# Patient Record
Sex: Male | Born: 2000
Health system: Southern US, Community
[De-identification: ages and names within clinical notes are randomized; demographics above are authoritative.]

---
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
## 2019-03-22 ENCOUNTER — Encounter (HOSPITAL_COMMUNITY): Payer: Self-pay | Admitting: Emergency Medicine

## 2019-03-22 ENCOUNTER — Emergency Department (HOSPITAL_COMMUNITY)
Admission: EM | Admit: 2019-03-22 | Discharge: 2019-03-22 | Disposition: A | Discharge status: Home / Self Care | Payer: Medicaid Other | Attending: Emergency Medicine | Admitting: Emergency Medicine

## 2019-03-22 ENCOUNTER — Other Ambulatory Visit: Payer: Self-pay

## 2019-03-22 DIAGNOSIS — Z7251 High risk heterosexual behavior: Secondary | ICD-10-CM

## 2019-03-22 DIAGNOSIS — A64 Unspecified sexually transmitted disease: Secondary | ICD-10-CM | POA: Diagnosis not present

## 2019-03-22 DIAGNOSIS — R369 Urethral discharge, unspecified: Secondary | ICD-10-CM | POA: Diagnosis not present

## 2019-03-22 LAB — URINALYSIS, ROUTINE W REFLEX MICROSCOPIC
Bacteria, UA: NONE SEEN
Bilirubin Urine: NEGATIVE
Glucose, UA: NEGATIVE mg/dL
Hgb urine dipstick: NEGATIVE
Ketones, ur: 5 mg/dL — AB
Nitrite: NEGATIVE
Protein, ur: 30 mg/dL — AB
Specific Gravity, Urine: 1.026 (ref 1.005–1.030)
pH: 5 (ref 5.0–8.0)

## 2019-03-22 MED ORDER — AZITHROMYCIN 250 MG PO TABS
1000.0000 mg | ORAL_TABLET | Freq: Once | ORAL | Status: AC
  Administered 2019-03-22: 21:00:00 1000 mg via ORAL
  Filled 2019-03-22: qty 4

## 2019-03-22 MED ORDER — CEFTRIAXONE SODIUM 250 MG IJ SOLR
250.0000 mg | Freq: Once | INTRAMUSCULAR | Status: AC
  Administered 2019-03-22: 21:00:00 250 mg via INTRAMUSCULAR
  Filled 2019-03-22: qty 250
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
## 2019-03-22 NOTE — ED Provider Notes (Signed)
[facility] Department Provider Note MRN:  [id number]  Arrival date & time: 03/22/19     Chief Complaint   Penile Discharge   History of Present Illness   Corey [name] is a 18 y.o. year-old male with no pertinent past medical history presenting to the ED with chief complaint of penile discharge.  2 to 3 days of burning and cloudy discharge from the penis.  Denies fever, pain is mild, intermittent.  Denies abdominal pain, no testicular pain.  2 or 3 sexual partners in the past 3 months.  Intermittent condom use.  Review of Systems  A problem-focused ROS was performed. Positive for penile discharge.  Patient denies fever.  Patient's Health History   History reviewed. No pertinent past medical history.  History reviewed. No pertinent surgical history.  No family history on file.  Social History   Socioeconomic History  . Marital status: Single    Spouse name: Not on file  . Number of children: Not on file  . Years of education: Not on file  . Highest education level: Not on file  Occupational History  . Not on file  Social Needs  . Financial resource strain: Not on file  . Food insecurity:    Worry: Not on file    Inability: Not on file  . Transportation needs:    Medical: Not on file    Non-medical: Not on file  Tobacco Use  . Smoking status: Never Smoker  Substance and Sexual Activity  . Alcohol use: No  . Drug use: Not on file  . Sexual activity: Not on file  Lifestyle  . Physical activity:    Days per week: Not on file    Minutes per session: Not on file  . Stress: Not on file  Relationships  . Social connections:    Talks on phone: Not on file    Gets together: Not on file    Attends religious service: Not on file    Active member of club or organization: Not on file    Attends meetings of clubs or organizations: Not on file    Relationship status: Not on file  . Intimate partner violence:    Fear of current or ex partner: Not  on file    Emotionally abused: Not on file    Physically abused: Not on file    Forced sexual activity: Not on file  Other Topics Concern  . Not on file  Social History Narrative  . Not on file     Physical Exam  Vital Signs and Nursing Notes reviewed Vitals:   03/22/19 1947  BP: 137/75  Pulse: 88  Resp: 17  Temp: 98.7 F (37.1 C)  SpO2: 100%    CONSTITUTIONAL: Well-appearing, NAD NEURO:  Alert and oriented x 3, no focal deficits EYES:  eyes equal and reactive ENT/NECK:  no LAD, no JVD CARDIO: Regular rate, well-perfused, normal S1 and S2 PULM:  CTAB no wheezing or rhonchi GI/GU:  normal bowel sounds, non-distended, non-tender; normal-appearing external genitalia, no significant lymphadenopathy, no scrotal or testicular tenderness, no evident penile discharge MSK/SPINE:  No gross deformities, no edema SKIN:  no rash, atraumatic PSYCH:  Appropriate speech and behavior  Diagnostic and Interventional Summary    Labs Reviewed  URINALYSIS, ROUTINE W REFLEX MICROSCOPIC - Abnormal; Notable for the following components:      Result Value   Ketones, ur 5 (*)    Protein, ur 30 (*)    Leukocytes,Ua SMALL (*)  All other components within normal limits  GC/CHLAMYDIA PROBE AMP ([place]) NOT AT [facility]    No orders to display    Medications  cefTRIAXone (ROCEPHIN) injection 250 mg (250 mg Intramuscular Given 03/22/19 2044)  azithromycin (ZITHROMAX) tablet 1,000 mg (1,000 mg Oral Given 03/22/19 2043)     Procedures Critical Care  ED Course and Medical Decision Making  I have reviewed the triage vital signs and the nursing notes.  Pertinent labs & imaging results that were available during my care of the patient were reviewed by me and considered in my medical decision making (see below for details).  Uncomplicated urethritis, treated empirically for gonorrhea/chlamydia.  Advised to inform partners, advised condom use, advised to follow-up with the health department for HIV  testing.  After the discussed management above, the patient was determined to be safe for discharge.  The patient was in agreement with this plan and all questions regarding their care were answered.  ED return precautions were discussed and the patient will return to the ED with any significant worsening of condition.  Elmer Sow. Pilar Plate, MD [facility] Emergency Medicine [facility] [email or web address]  Final Clinical Impressions(s) / ED Diagnoses     ICD-10-CM   1. Unprotected sex Z72.51   2. Urethral discharge in male R36.9   3. STD (male) A64     ED Discharge Orders    None         Sabas Sous, MD 03/22/19 2124

## 2019-03-22 NOTE — Discharge Instructions (Addendum)
You were evaluated in the Emergency Department and after careful evaluation, we did not find any emergent condition requiring admission or further testing in the hospital.  Your symptoms today seem to be due to a sexually transmitted disease.  We treated you with antibiotics here in the emergency department.  Your other partners will need to be tested or treated.  Please return to the Emergency Department if you experience any worsening of your condition.  We encourage you to follow up with a primary care provider.  Thank you for allowing [place] to be a part of your care.

## 2019-03-29 ENCOUNTER — Other Ambulatory Visit: Payer: Self-pay

## 2019-03-29 ENCOUNTER — Emergency Department (HOSPITAL_COMMUNITY)
Admission: EM | Admit: 2019-03-29 | Discharge: 2019-03-29 | Disposition: A | Discharge status: Home / Self Care | Payer: Medicaid Other | Attending: Emergency Medicine | Admitting: Emergency Medicine

## 2019-03-29 DIAGNOSIS — R369 Urethral discharge, unspecified: Secondary | ICD-10-CM | POA: Diagnosis not present

## 2019-03-29 DIAGNOSIS — R300 Dysuria: Secondary | ICD-10-CM | POA: Diagnosis present

## 2019-03-29 LAB — URINALYSIS, ROUTINE W REFLEX MICROSCOPIC
Bacteria, UA: NONE SEEN
Bilirubin Urine: NEGATIVE
Glucose, UA: NEGATIVE mg/dL
Hgb urine dipstick: NEGATIVE
Ketones, ur: 5 mg/dL — AB
Nitrite: NEGATIVE
Protein, ur: 100 mg/dL — AB
Specific Gravity, Urine: 1.035 — ABNORMAL HIGH (ref 1.005–1.030)
pH: 5 (ref 5.0–8.0)

## 2019-03-29 MED ORDER — AZITHROMYCIN 250 MG PO TABS
1000.0000 mg | ORAL_TABLET | Freq: Once | ORAL | Status: AC
  Administered 2019-03-29: 18:00:00 1000 mg via ORAL
  Filled 2019-03-29: qty 4

## 2019-03-29 MED ORDER — CEFTRIAXONE SODIUM 250 MG IJ SOLR
250.0000 mg | Freq: Once | INTRAMUSCULAR | Status: AC
  Administered 2019-03-29: 18:00:00 250 mg via INTRAMUSCULAR
  Filled 2019-03-29: qty 250

## 2019-03-29 NOTE — ED Provider Notes (Signed)
[facility] EMERGENCY DEPARTMENT Provider Note   CSN: [id number] Arrival date & time: 03/29/19  1704    History   Chief Complaint Chief Complaint  Patient presents with  . SEXUALLY TRANSMITTED DISEASE    HPI Corey [name] is a 18 y.o. male.     The history is provided by the patient and medical records. No language interpreter was used.   Corey [name] is a 18 y.o. male who presents to the Emergency Department complaining of dysuria for about 7 to 10 days.  Nauseated with clear penile discharge.  He does report having unprotected intercourse.  He was seen in the emergency department on 5/09 for same.  Was treated prophylactically for gonorrhea and chlamydia.  Appears G&C testing was done, but is not in process for some reason.  There are no results in epic.  He states that he did feel better for about a day or 2, then symptoms returned.  He did continue to have unprotected intercourse the very following day with the same partner.  Denies any abdominal pain.  No fever or chills.  No diarrhea, constipation, blood in stools or rectal pain.   No past medical history on file.  There are no active problems to display for this patient.   No past surgical history on file.      Home Medications    Prior to Admission medications   Not on File    Family History No family history on file.  Social History Social History   Tobacco Use  . Smoking status: Never Smoker  Substance Use Topics  . Alcohol use: No  . Drug use: Not on file     Allergies   Patient has no known allergies.   Review of Systems Review of Systems  Constitutional: Negative for chills and fever.  Gastrointestinal: Negative for abdominal pain, blood in stool, constipation, diarrhea, nausea and rectal pain.  Genitourinary: Positive for discharge and dysuria. Negative for flank pain, penile swelling, scrotal swelling, testicular pain and urgency.     Physical Exam Updated Vital Signs  BP 129/73   Pulse 71   Temp 97.8 F (36.6 C) (Oral)   Resp 18   Ht 5\' 8"  (1.727 m)   Wt 65 kg   SpO2 99%   BMI 21.79 kg/m   Physical Exam Vitals signs and nursing note reviewed.  Constitutional:      General: He is not in acute distress.    Appearance: He is well-developed.     Comments: Well appearing.  HENT:     Head: Normocephalic and atraumatic.  Neck:     Musculoskeletal: Neck supple.  Cardiovascular:     Rate and Rhythm: Normal rate and regular rhythm.     Heart sounds: Normal heart sounds. No murmur.  Pulmonary:     Effort: Pulmonary effort is normal. No respiratory distress.     Breath sounds: Normal breath sounds.  Abdominal:     General: There is no distension.     Palpations: Abdomen is soft.     Comments: No abdominal tenderness.  Genitourinary:    Comments: Chaperone present for exam. No discharge from penis. No signs of lesion or erythema on the penis or testicles. The penis and testicles are nontender. No testicular masses or swelling. No signs of any inguinal hernias. Cremaster reflex present bilaterally. Skin:    General: Skin is warm and dry.  Neurological:     Mental Status: He is alert and oriented to person, place,  and time.      ED Treatments / Results  Labs (all labs ordered are listed, but only abnormal results are displayed) Labs Reviewed  URINALYSIS, ROUTINE W REFLEX MICROSCOPIC - Abnormal; Notable for the following components:      Result Value   APPearance HAZY (*)    Specific Gravity, Urine 1.035 (*)    Ketones, ur 5 (*)    Protein, ur 100 (*)    Leukocytes,Ua SMALL (*)    All other components within normal limits  GC/CHLAMYDIA PROBE AMP ([place]) NOT AT [facility]    EKG None  Radiology No results found.  Procedures Procedures (including critical care time)  Medications Ordered in ED Medications  cefTRIAXone (ROCEPHIN) injection 250 mg (has no administration in time range)  azithromycin (ZITHROMAX) tablet 1,000 mg (has  no administration in time range)     Initial Impression / Assessment and Plan / ED Course  I have reviewed the triage vital signs and the nursing notes.  Pertinent labs & imaging results that were available during my care of the patient were reviewed by me and considered in my medical decision making (see chart for details).       Corey [name] is a 18 y.o. male who presents to ED for dysuria and clear penile discharge for approximately 7-10 days.  In the emergency department for same on 5/09.  Chart reviewed from this encounter.  Does appear that gonorrhea/chlamydia test was obtained, but never put into process.  There are no results from this.  He was prophylactically treated for gonorrhea/chlamydia.  He does state that he had improvement for about a day, maybe 2, then his symptoms returned.  He states that he never abstain from sexual intercourse and continue to have unprotected intercourse either that night or the next day.  Urinalysis today with small leukocytes and no bacteria.  Less WBCs than last week.  Repeat gonorrhea/chlamydia sent.  Given he did not abstain from intercourse whatsoever, likely has the infection. He is afebrile without abdominal tenderness, abdominal pain or painful bowel movements to indicate prostatitis.  No tenderness to palpation of the testes or epididymis to suggest orchitis or epididymitis. Discussed importance of using protection when sexually active. Patient understands that they have GC/Chlamydia cultures pending and that they will need to inform all sexual partners if results return positive. Patient has been treated prophylactically with azithromycin and Rocephin once again.  I encouraged him to abstain from any sexual encounters until he has no symptoms and has also waited 14 days.  Return precautions given and all questions answered.    Final Clinical Impressions(s) / ED Diagnoses   Final diagnoses:  Dysuria  Penile discharge    ED Discharge Orders     None       Ward, Chase PicketJaime Pilcher, PA-C 03/29/19 1819    Alvira MondaySchlossman, Erin, MD 03/31/19 1529

## 2019-03-29 NOTE — ED Notes (Signed)
Patient verbalizes understanding of discharge instructions . Opportunity for questions and answers were provided . Armband removed by staff ,Pt discharged from ED. W/C  offered at D/C  and Declined W/C at D/C and was escorted to lobby by RN.  

## 2019-03-29 NOTE — ED Triage Notes (Signed)
Pt states he was diagnosed with chlamydia x 1 week ago and states that he is still having a burning sensation since then ; pt reports still having unprotected sex

## 2019-03-29 NOTE — Discharge Instructions (Signed)
Do not have sex for 2 weeks AND until all symptoms have resolved. This is to prevent you from catching another STD again or passing it on to someone else.  Use a condom with every sexual encounter.  Follow up with your doctor or the health department in regards to today's visit.  Please return to the ER for fevers, vomiting, new or worsening symptoms, any additional concerns.   You have been tested for chlamydia and gonorrhea. These results will be available in approximately 3 days. You will be notified if they are positive.

## 2019-03-31 LAB — GC/CHLAMYDIA PROBE AMP (~~LOC~~) NOT AT ARMC
Chlamydia: POSITIVE — AB
Neisseria Gonorrhea: NEGATIVE

## 2019-04-09 ENCOUNTER — Other Ambulatory Visit: Payer: Self-pay

## 2019-04-09 ENCOUNTER — Encounter (HOSPITAL_COMMUNITY): Payer: Self-pay | Admitting: Emergency Medicine

## 2019-04-09 ENCOUNTER — Emergency Department (HOSPITAL_COMMUNITY)
Admission: EM | Admit: 2019-04-09 | Discharge: 2019-04-09 | Disposition: A | Discharge status: Home / Self Care | Payer: Medicaid Other | Attending: Emergency Medicine | Admitting: Emergency Medicine

## 2019-04-09 DIAGNOSIS — R300 Dysuria: Secondary | ICD-10-CM | POA: Insufficient documentation

## 2019-04-09 DIAGNOSIS — Z202 Contact with and (suspected) exposure to infections with a predominantly sexual mode of transmission: Secondary | ICD-10-CM | POA: Diagnosis not present

## 2019-04-09 DIAGNOSIS — R369 Urethral discharge, unspecified: Secondary | ICD-10-CM | POA: Diagnosis present

## 2019-04-09 MED ORDER — STERILE WATER FOR INJECTION IJ SOLN
INTRAMUSCULAR | Status: AC
  Filled 2019-04-09: qty 10

## 2019-04-09 MED ORDER — CEFTRIAXONE SODIUM 250 MG IJ SOLR
250.0000 mg | Freq: Once | INTRAMUSCULAR | Status: AC
  Administered 2019-04-09: 250 mg via INTRAMUSCULAR
  Filled 2019-04-09: qty 250

## 2019-04-09 MED ORDER — AZITHROMYCIN 250 MG PO TABS
1000.0000 mg | ORAL_TABLET | Freq: Once | ORAL | Status: AC
  Administered 2019-04-09: 22:00:00 1000 mg via ORAL
  Filled 2019-04-09: qty 4

## 2019-04-09 NOTE — ED Provider Notes (Signed)
[facility] Provider Note   CSN: [id number] Arrival date & time: 04/09/19  2044    History   Chief Complaint Chief Complaint  Patient presents with  . Penile Discharge    HPI Corey [name] is a 18 y.o. male who presents with recurrent penile discharge after having been treated 10 days ago.  Patient reports he had sexual intercourse when he was told to abstain.  Patient reports he had intercourse with the same person.  Per chart review, patient tested positive for chlamydia.  Patient denies any scrotal pain or swelling.  He does report some dysuria.     HPI  History reviewed. No pertinent past medical history.  There are no active problems to display for this patient.   History reviewed. No pertinent surgical history.      Home Medications    Prior to Admission medications   Not on File    Family History History reviewed. No pertinent family history.  Social History Social History   Tobacco Use  . Smoking status: Never Smoker  Substance Use Topics  . Alcohol use: No  . Drug use: Not on file     Allergies   Patient has no known allergies.   Review of Systems Review of Systems  Constitutional: Negative for fever.  Genitourinary: Positive for discharge and dysuria. Negative for penile pain, penile swelling, scrotal swelling and testicular pain.     Physical Exam Updated Vital Signs BP 139/73 (BP Location: Right Arm)   Pulse 81   Temp 98.2 F (36.8 C) (Oral)   SpO2 98%   Physical Exam Vitals signs and nursing note reviewed. Exam conducted with a chaperone present.  Constitutional:      General: He is not in acute distress.    Appearance: He is well-developed. He is not diaphoretic.  HENT:     Head: Normocephalic and atraumatic.     Mouth/Throat:     Pharynx: No oropharyngeal exudate.  Eyes:     General: No scleral icterus.       Right eye: No discharge.        Left eye: No discharge.   Conjunctiva/sclera: Conjunctivae normal.     Pupils: Pupils are equal, round, and reactive to light.  Neck:     Musculoskeletal: Normal range of motion and neck supple.     Thyroid: No thyromegaly.  Cardiovascular:     Rate and Rhythm: Normal rate and regular rhythm.     Heart sounds: Normal heart sounds. No murmur. No friction rub. No gallop.   Pulmonary:     Effort: Pulmonary effort is normal. No respiratory distress.     Breath sounds: Normal breath sounds. No stridor. No wheezing or rales.  Abdominal:     General: Bowel sounds are normal. There is no distension.     Palpations: Abdomen is soft.     Tenderness: There is no abdominal tenderness. There is no guarding or rebound.  Genitourinary:    Penis: Discharge present.      Scrotum/Testes: Normal.        Right: Mass, tenderness or swelling not present.        Left: Mass, tenderness or swelling not present.     Epididymis:     Right: Normal.     Left: Normal.  Lymphadenopathy:     Cervical: No cervical adenopathy.     Lower Body: No right inguinal adenopathy. No left inguinal adenopathy.  Skin:    General: Skin is warm  and dry.     Coloration: Skin is not pale.     Findings: No rash.  Neurological:     Mental Status: He is alert.     Coordination: Coordination normal.      ED Treatments / Results  Labs (all labs ordered are listed, but only abnormal results are displayed) Labs Reviewed  RPR  HIV ANTIBODY (ROUTINE TESTING W REFLEX)  GC/CHLAMYDIA PROBE AMP ([place]) NOT AT [facility]    EKG None  Radiology No results found.  Procedures Procedures (including critical care time)  Medications Ordered in ED Medications  sterile water (preservative free) injection (has no administration in time range)  cefTRIAXone (ROCEPHIN) injection 250 mg (250 mg Intramuscular Given 04/09/19 2154)  azithromycin (ZITHROMAX) tablet 1,000 mg (1,000 mg Oral Given 04/09/19 2154)     Initial Impression / Assessment and Plan / ED  Course  I have reviewed the triage vital signs and the nursing notes.  Pertinent labs & imaging results that were available during my care of the patient were reviewed by me and considered in my medical decision making (see chart for details).        Patient returning for recurrent penile discharge after testing positive for chlamydia last week.  Patient was treated, however he had sexual intercourse.  He and his partner develop symptoms again.  Repeat urethral swab sent for GC/chlamydia.  HIV, RPR pending.  Rocephin and azithromycin given again.  I stressed the importance of abstaining for a week following treatment.  He understands and agrees with plan.  Patient vitals stable throughout ED course and discharged in satisfactory condition.  Final Clinical Impressions(s) / ED Diagnoses   Final diagnoses:  Penile discharge  Exposure to STD    ED Discharge Orders    None       Emi Holes, Cordelia Poche 04/09/19 2200    Wynetta Fines, MD 04/15/19 1452

## 2019-04-09 NOTE — ED Triage Notes (Signed)
Pt presents with continued penile discharge, was here 1 wk ago for same and given STI medications, was asked to abstain from sex but had unprotected sex 2 days ago, penile discharge recurrent

## 2019-04-09 NOTE — Discharge Instructions (Addendum)
You have been treated for gonorrhea and chlamydia today. You will be called in 3 days if any of your tests return positive. In that case, please make all of your sexual partners aware that they will need to be treated as well. Abstain from intercourse for one week until you have both been treated. Use condoms in the future to help prevent sexually transmitted disease and unwanted pregnancy. You can go to the health department in the future for free STD testing. ° °

## 2019-04-10 LAB — GC/CHLAMYDIA PROBE AMP (~~LOC~~) NOT AT ARMC
Chlamydia: NEGATIVE
Neisseria Gonorrhea: NEGATIVE

## 2019-04-10 LAB — HIV ANTIBODY (ROUTINE TESTING W REFLEX): HIV Screen 4th Generation wRfx: NONREACTIVE

## 2019-04-10 LAB — RPR: RPR Ser Ql: NONREACTIVE

## 2019-07-12 ENCOUNTER — Other Ambulatory Visit: Payer: Self-pay

## 2019-07-12 ENCOUNTER — Encounter (HOSPITAL_COMMUNITY): Payer: Self-pay

## 2019-07-12 ENCOUNTER — Emergency Department (HOSPITAL_COMMUNITY)
Admission: EM | Admit: 2019-07-12 | Discharge: 2019-07-12 | Disposition: A | Discharge status: Home / Self Care | Payer: Medicaid Other | Attending: Emergency Medicine | Admitting: Emergency Medicine

## 2019-07-12 DIAGNOSIS — M791 Myalgia, unspecified site: Secondary | ICD-10-CM | POA: Diagnosis not present

## 2019-07-12 DIAGNOSIS — R05 Cough: Secondary | ICD-10-CM | POA: Diagnosis not present

## 2019-07-12 DIAGNOSIS — J029 Acute pharyngitis, unspecified: Secondary | ICD-10-CM | POA: Diagnosis present

## 2019-07-12 DIAGNOSIS — Z20828 Contact with and (suspected) exposure to other viral communicable diseases: Secondary | ICD-10-CM | POA: Diagnosis not present

## 2019-07-12 DIAGNOSIS — R509 Fever, unspecified: Secondary | ICD-10-CM

## 2019-07-12 LAB — GROUP A STREP BY PCR: Group A Strep by PCR: NOT DETECTED

## 2019-07-12 MED ORDER — ACETAMINOPHEN 500 MG PO TABS
1000.0000 mg | ORAL_TABLET | Freq: Once | ORAL | Status: AC
Start: 2019-07-12 — End: 2019-07-12
  Administered 2019-07-12: 13:00:00 1000 mg via ORAL
  Filled 2019-07-12: qty 2

## 2019-07-12 NOTE — ED Provider Notes (Signed)
[facility] EMERGENCY DEPARTMENT Provider Note   CSN: [id number] Arrival date & time: 07/12/19  1236     History   Chief Complaint Chief Complaint  Patient presents with   Sore Throat    HPI Corey [name] is a 18 y.o. male presents for evaluation of sore throat, body aches, chills that have been ongoing for the last 3 days.  He states he has not measured a temperature but has had some subjective fevers.  He states that he took 1 dose of Tylenol for his sore throat with minimal improvement.  He states he is still able to tolerate his secretions and tolerate p.o. but does report worsening pain with attempting to do so.  He states he has had a little bit of cough that is productive of mucus.  Denies any difficulty breathing, chest pain.  He states that he has not had any recent travel and denies any recent COVID-19 exposure.  He does report that he has had oral sex in the last 2 weeks.     The history is provided by the patient.    History reviewed. No pertinent past medical history.  There are no active problems to display for this patient.   History reviewed. No pertinent surgical history.      Home Medications    Prior to Admission medications   Not on File    Family History No family history on file.  Social History Social History   Tobacco Use   Smoking status: Never Smoker  Substance Use Topics   Alcohol use: No   Drug use: Not on file     Allergies   Patient has no known allergies.   Review of Systems Review of Systems  Constitutional: Positive for chills and fever.  HENT: Positive for sore throat. Negative for trouble swallowing.   Respiratory: Negative for cough and shortness of breath.   Cardiovascular: Negative for chest pain.  Gastrointestinal: Negative for abdominal pain, nausea and vomiting.  Musculoskeletal: Positive for myalgias.  All other systems reviewed and are negative.    Physical Exam Updated Vital  Signs BP 137/88 (BP Location: Right Arm)    Pulse 93    Temp (!) 100.6 F (38.1 C) (Oral)    Resp 14    Ht 5\' 8"  (1.727 m)    Wt 65.5 kg    SpO2 99%    BMI 21.96 kg/m   Physical Exam Vitals signs and nursing note reviewed.  Constitutional:      Appearance: He is well-developed.  HENT:     Head: Normocephalic and atraumatic.     Mouth/Throat:     Pharynx: Posterior oropharyngeal erythema present.     Comments: Posterior oropharynx with erythema.  No tonsillar edema, exudates.  Uvula is midline.  Airways patent, phonation is intact. Eyes:     General: No scleral icterus.       Right eye: No discharge.        Left eye: No discharge.     Conjunctiva/sclera: Conjunctivae normal.  Pulmonary:     Effort: Pulmonary effort is normal.  Lymphadenopathy:     Cervical: Cervical adenopathy present.  Skin:    General: Skin is warm and dry.  Neurological:     Mental Status: He is alert.  Psychiatric:        Speech: Speech normal.        Behavior: Behavior normal.      ED Treatments / Results  Labs (all labs ordered are  listed, but only abnormal results are displayed) Labs Reviewed  GROUP A STREP BY PCR  NOVEL CORONAVIRUS, NAA (HOSP ORDER, SEND-OUT TO REF LAB; TAT 18-24 HRS)    EKG None  Radiology No results found.  Procedures Procedures (including critical care time)  Medications Ordered in ED Medications  acetaminophen (TYLENOL) tablet 1,000 mg (1,000 mg Oral Given 07/12/19 1324)     Initial Impression / Assessment and Plan / ED Course  I have reviewed the triage vital signs and the nursing notes.  Pertinent labs & imaging results that were available during my care of the patient were reviewed by me and considered in my medical decision making (see chart for details).        [age] year old male who presents for evaluation of sore throat x3 days.  Reports subjective fever/chills, body aches.  No known travel or known COVID-19 exposure.  On initial ED arrival, he is  febrile at 100.6.  Vitals otherwise stable.  Consider pharyngitis.  Consider COVID-19.  History/physical exam not concerning for peritonsillar abscess, Ludwig angina.  Plan for strep test.  Strep is negative.  We will plan to do outpatient COVID testing.  Discussed results with patient.  Instructed patient that he should engage in home isolation until COVID results return.  At this time, he was hemodynamically stable with no difficulty breathing.  He stable for discharge at this time.  Encouraged at home supportive care measures. At this time, patient exhibits no emergent life-threatening condition that require further evaluation in ED or admission. Patient had ample opportunity for questions and discussion. All patient's questions were answered with full understanding. Strict return precautions discussed. Patient expresses understanding and agreement to plan.   Corey [name] was evaluated in Emergency Department on 07/12/2019 for the symptoms described in the history of present illness. He was evaluated in the context of the global COVID-19 pandemic, which necessitated consideration that the patient might be at risk for infection with the SARS-CoV-2 virus that causes COVID-19. Institutional protocols and algorithms that pertain to the evaluation of patients at risk for COVID-19 are in a state of rapid change based on information released by regulatory bodies including the CDC and federal and state organizations. These policies and algorithms were followed during the patient's care in the ED.  Portions of this note were generated with Scientist, clinical (histocompatibility and immunogenetics)Dragon dictation software. Dictation errors may occur despite best attempts at proofreading.  Final Clinical Impressions(s) / ED Diagnoses   Final diagnoses:  Sore throat  Febrile illness    ED Discharge Orders    None       Rosana HoesLayden, Gerilynn Mccullars A, PA-C 07/12/19 [id number]    Tegeler, Canary Brimhristopher J, MD 07/12/19 [phone]

## 2019-07-12 NOTE — Discharge Instructions (Addendum)
Your strep test was negative.  You have also been tested for COVID.  The results will be back in 24 to 48 hours.  You should quarantine and self isolate yourself until the results come back.  You can check online to see if the results are back and have not heard.  Make sure you are staying hydrated drink plenty of fluids.  You can take Tylenol or Ibuprofen as directed for pain. You can alternate Tylenol and Ibuprofen every 4 hours. If you take Tylenol at 1pm, then you can take Ibuprofen at 5pm. Then you can take Tylenol again at 9pm.   Return the emergency department for any difficulty breathing, chest pain, vomiting or any other worsening or concerning symptoms.    Person Under Monitoring Name: Corey [name]  [place] [address] [place] [id number]   Infection Prevention Recommendations for Individuals Confirmed to have, or Being Evaluated for, 2019 Novel Coronavirus (COVID-19) Infection Who Receive Care at Home  Individuals who are confirmed to have, or are being evaluated for, COVID-19 should follow the prevention steps below until a healthcare provider or local or state health department says they can return to normal activities.  Stay home except to get medical care You should restrict activities outside your home, except for getting medical care. Do not go to work, school, or public areas, and do not use public transportation or taxis.  Call ahead before visiting your doctor Before your medical appointment, call the healthcare provider and tell them that you have, or are being evaluated for, COVID-19 infection. This will help the healthcare providers office take steps to keep other people from getting infected. Ask your healthcare provider to call the local or state health department.  Monitor your symptoms Seek prompt medical attention if your illness is worsening (e.g., difficulty breathing). Before going to your medical appointment, call the healthcare provider  and tell them that you have, or are being evaluated for, COVID-19 infection. Ask your healthcare provider to call the local or state health department.  Wear a facemask You should wear a facemask that covers your nose and mouth when you are in the same room with other people and when you visit a healthcare provider. People who live with or visit you should also wear a facemask while they are in the same room with you.  Separate yourself from other people in your home As much as possible, you should stay in a different room from other people in your home. Also, you should use a separate bathroom, if available.  Avoid sharing household items You should not share dishes, drinking glasses, cups, eating utensils, towels, bedding, or other items with other people in your home. After using these items, you should wash them thoroughly with soap and water.  Cover your coughs and sneezes Cover your mouth and nose with a tissue when you cough or sneeze, or you can cough or sneeze into your sleeve. Throw used tissues in a lined trash can, and immediately wash your hands with soap and water for at least 20 seconds or use an alcohol-based hand rub.  Wash your [employer] Corporationhands Wash your hands often and thoroughly with soap and water for at least 20 seconds. You can use an alcohol-based hand sanitizer if soap and water are not available and if your hands are not visibly dirty. Avoid touching your eyes, nose, and mouth with unwashed hands.   Prevention Steps for Caregivers and Household Members of Individuals Confirmed to have, or Being Evaluated  for, COVID-19 Infection Being Cared for in the Home  If you live with, or provide care at home for, a person confirmed to have, or being evaluated for, COVID-19 infection please follow these guidelines to prevent infection:  Follow healthcare providers instructions Make sure that you understand and can help the patient follow any healthcare provider instructions for  all care.  Provide for the patients basic needs You should help the patient with basic needs in the home and provide support for getting groceries, prescriptions, and other personal needs.  Monitor the patients symptoms If they are getting sicker, call his or her medical provider and tell them that the patient has, or is being evaluated for, COVID-19 infection. This will help the healthcare providers office take steps to keep other people from getting infected. Ask the healthcare provider to call the local or state health department.  Limit the number of people who have contact with the patient If possible, have only one caregiver for the patient. Other household members should stay in another home or place of residence. If this is not possible, they should stay in another room, or be separated from the patient as much as possible. Use a separate bathroom, if available. Restrict visitors who do not have an essential need to be in the home.  Keep older adults, very young children, and other sick people away from the patient Keep older adults, very young children, and those who have compromised immune systems or chronic health conditions away from the patient. This includes people with chronic heart, lung, or kidney conditions, diabetes, and cancer.  Ensure good ventilation Make sure that shared spaces in the home have good air flow, such as from an air conditioner or an opened window, weather permitting.  Wash your hands often Wash your hands often and thoroughly with soap and water for at least 20 seconds. You can use an alcohol based hand sanitizer if soap and water are not available and if your hands are not visibly dirty. Avoid touching your eyes, nose, and mouth with unwashed hands. Use disposable paper towels to dry your hands. If not available, use dedicated cloth towels and replace them when they become wet.  Wear a facemask and gloves Wear a disposable facemask at all times  in the room and gloves when you touch or have contact with the patients blood, body fluids, and/or secretions or excretions, such as sweat, saliva, sputum, nasal mucus, vomit, urine, or feces.  Ensure the mask fits over your nose and mouth tightly, and do not touch it during use. Throw out disposable facemasks and gloves after using them. Do not reuse. Wash your hands immediately after removing your facemask and gloves. If your personal clothing becomes contaminated, carefully remove clothing and launder. Wash your hands after handling contaminated clothing. Place all used disposable facemasks, gloves, and other waste in a lined container before disposing them with other household waste. Remove gloves and wash your hands immediately after handling these items.  Do not share dishes, glasses, or other household items with the patient Avoid sharing household items. You should not share dishes, drinking glasses, cups, eating utensils, towels, bedding, or other items with a patient who is confirmed to have, or being evaluated for, COVID-19 infection. After the person uses these items, you should wash them thoroughly with soap and water.  Wash laundry thoroughly Immediately remove and wash clothes or bedding that have blood, body fluids, and/or secretions or excretions, such as sweat, saliva, sputum, nasal mucus, vomit, urine,  or feces, on them. Wear gloves when handling laundry from the patient. Read and follow directions on labels of laundry or clothing items and detergent. In general, wash and dry with the warmest temperatures recommended on the label.  Clean all areas the individual has used often Clean all touchable surfaces, such as counters, tabletops, doorknobs, bathroom fixtures, toilets, phones, keyboards, tablets, and bedside tables, every day. Also, clean any surfaces that may have blood, body fluids, and/or secretions or excretions on them. Wear gloves when cleaning surfaces the patient has  come in contact with. Use a diluted bleach solution (e.g., dilute bleach with 1 part bleach and 10 parts water) or a household disinfectant with a label that says EPA-registered for coronaviruses. To make a bleach solution at home, add 1 tablespoon of bleach to 1 quart (4 cups) of water. For a larger supply, add  cup of bleach to 1 gallon (16 cups) of water. Read labels of cleaning products and follow recommendations provided on product labels. Labels contain instructions for safe and effective use of the cleaning product including precautions you should take when applying the product, such as wearing gloves or eye protection and making sure you have good ventilation during use of the product. Remove gloves and wash hands immediately after cleaning.  Monitor yourself for signs and symptoms of illness Caregivers and household members are considered close contacts, should monitor their health, and will be asked to limit movement outside of the home to the extent possible. Follow the monitoring steps for close contacts listed on the symptom monitoring form.   ? If you have additional questions, contact your local health department or call the epidemiologist on call at [phone] (available 24/7). ? This guidance is subject to change. For the most up-to-date guidance from [facility], please refer to their website: [email or web address]

## 2019-07-12 NOTE — ED Triage Notes (Signed)
Pt states he has a sore throat and a fever was noted upon assessing vital signs. A strep test was performed per order. The pt is stable and resting comfortably at this time.

## 2019-07-13 LAB — NOVEL CORONAVIRUS, NAA (HOSP ORDER, SEND-OUT TO REF LAB; TAT 18-24 HRS): SARS-CoV-2, NAA: NOT DETECTED

## 2019-08-10 ENCOUNTER — Emergency Department (HOSPITAL_COMMUNITY)
Admission: EM | Admit: 2019-08-10 | Discharge: 2019-08-10 | Disposition: A | Discharge status: Home / Self Care | Payer: Medicaid Other | Attending: Emergency Medicine | Admitting: Emergency Medicine

## 2019-08-10 ENCOUNTER — Encounter (HOSPITAL_COMMUNITY): Payer: Self-pay | Admitting: Emergency Medicine

## 2019-08-10 ENCOUNTER — Other Ambulatory Visit: Payer: Self-pay

## 2019-08-10 DIAGNOSIS — R300 Dysuria: Secondary | ICD-10-CM | POA: Diagnosis not present

## 2019-08-10 DIAGNOSIS — Z5321 Procedure and treatment not carried out due to patient leaving prior to being seen by health care provider: Secondary | ICD-10-CM | POA: Diagnosis not present

## 2019-08-10 LAB — URINALYSIS, ROUTINE W REFLEX MICROSCOPIC
Bilirubin Urine: NEGATIVE
Glucose, UA: NEGATIVE mg/dL
Hgb urine dipstick: NEGATIVE
Ketones, ur: 5 mg/dL — AB
Nitrite: NEGATIVE
Protein, ur: 30 mg/dL — AB
Specific Gravity, Urine: 1.036 — ABNORMAL HIGH (ref 1.005–1.030)
WBC, UA: >50 WBC/hpf — ABNORMAL HIGH (ref 0–5)
pH: 5 (ref 5.0–8.0)

## 2019-08-10 NOTE — ED Triage Notes (Signed)
Pt c/o white/yellow penile discharge x 2-3 days with dysuria.

## 2019-08-10 NOTE — ED Notes (Signed)
Patient walked out with girlfriend.

## 2019-08-14 ENCOUNTER — Other Ambulatory Visit: Payer: Self-pay

## 2019-08-14 ENCOUNTER — Emergency Department (HOSPITAL_COMMUNITY)
Admission: EM | Admit: 2019-08-14 | Discharge: 2019-08-14 | Disposition: A | Discharge status: Home / Self Care | Payer: Medicaid Other | Attending: Emergency Medicine | Admitting: Emergency Medicine

## 2019-08-14 ENCOUNTER — Encounter (HOSPITAL_COMMUNITY): Payer: Self-pay

## 2019-08-14 DIAGNOSIS — R369 Urethral discharge, unspecified: Secondary | ICD-10-CM | POA: Diagnosis present

## 2019-08-14 DIAGNOSIS — F17200 Nicotine dependence, unspecified, uncomplicated: Secondary | ICD-10-CM | POA: Insufficient documentation

## 2019-08-14 DIAGNOSIS — N4889 Other specified disorders of penis: Secondary | ICD-10-CM | POA: Diagnosis not present

## 2019-08-14 DIAGNOSIS — R300 Dysuria: Secondary | ICD-10-CM | POA: Insufficient documentation

## 2019-08-14 DIAGNOSIS — Z202 Contact with and (suspected) exposure to infections with a predominantly sexual mode of transmission: Secondary | ICD-10-CM | POA: Diagnosis not present

## 2019-08-14 LAB — HIV ANTIBODY (ROUTINE TESTING W REFLEX): HIV Screen 4th Generation wRfx: NONREACTIVE

## 2019-08-14 MED ORDER — CEFTRIAXONE SODIUM 250 MG IJ SOLR
250.0000 mg | Freq: Once | INTRAMUSCULAR | Status: AC
  Administered 2019-08-14: 250 mg via INTRAMUSCULAR
  Filled 2019-08-14: qty 250

## 2019-08-14 MED ORDER — AZITHROMYCIN 250 MG PO TABS
1000.0000 mg | ORAL_TABLET | Freq: Once | ORAL | Status: AC
  Administered 2019-08-14: 1000 mg via ORAL
  Filled 2019-08-14: qty 4

## 2019-08-14 NOTE — ED Triage Notes (Signed)
Pt requesting to be treated for chlamydia. States him and his gf were her a few days ago and were tested, states they were told not to have intercourse but they did. Pt still having discharge

## 2019-08-14 NOTE — ED Provider Notes (Signed)
[facility] EMERGENCY DEPARTMENT Provider Note   CSN: [id number] Arrival date & time: 08/14/19  1231     History   Chief Complaint Chief Complaint  Patient presents with  . Exposure to STD    HPI Corey [name] is a 18 y.o. male who presents with penile discharge.  He states that symptoms started approximately 1 week and a half ago.  He states that he came here about 4 days ago but left without being seen due to long wait times.  His girlfriend is having symptoms as well and also here for an evaluation.  He states that symptoms got better for a couple of days but then got worse again.  He denies fever, abdominal pain, testicular pain, vomiting.  He states that he got a shot and pills an outside facility but continued to have intercourse with his girlfriend and did not wait the full 2 weeks     HPI  History reviewed. No pertinent past medical history.  There are no active problems to display for this patient.   History reviewed. No pertinent surgical history.      Home Medications    Prior to Admission medications   Not on File    Family History No family history on file.  Social History Social History   Tobacco Use  . Smoking status: Current Every Day Smoker  . Smokeless tobacco: Never Used  Substance Use Topics  . Alcohol use: No  . Drug use: Not Currently     Allergies   Patient has no known allergies.   Review of Systems Review of Systems  Constitutional: Negative for fever.  Genitourinary: Positive for discharge, dysuria and penile pain. Negative for frequency and testicular pain.  All other systems reviewed and are negative.    Physical Exam Updated Vital Signs BP 132/76 (BP Location: Right Arm)   Pulse 67   Temp 98.6 F (37 C) (Oral)   Resp 18   SpO2 100%   Physical Exam Vitals signs and nursing note reviewed.  Constitutional:      General: He is not in acute distress.    Appearance: Normal appearance. He is  well-developed.  HENT:     Head: Normocephalic and atraumatic.  Eyes:     General: No scleral icterus.       Right eye: No discharge.        Left eye: No discharge.     Conjunctiva/sclera: Conjunctivae normal.     Pupils: Pupils are equal, round, and reactive to light.  Neck:     Musculoskeletal: Normal range of motion.  Cardiovascular:     Rate and Rhythm: Normal rate.  Pulmonary:     Effort: Pulmonary effort is normal. No respiratory distress.  Abdominal:     General: There is no distension.  Skin:    General: Skin is warm and dry.  Neurological:     Mental Status: He is alert and oriented to person, place, and time.  Psychiatric:        Behavior: Behavior normal.      ED Treatments / Results  Labs (all labs ordered are listed, but only abnormal results are displayed) Labs Reviewed  RPR  HIV ANTIBODY (ROUTINE TESTING W REFLEX)  HIV4GL SAVE TUBE  GC/CHLAMYDIA PROBE AMP ([place]) NOT AT [facility]    EKG None  Radiology No results found.  Procedures Procedures (including critical care time)  Medications Ordered in ED Medications  cefTRIAXone (ROCEPHIN) injection 250 mg (250 mg Intramuscular  Given 08/14/19 1420)  azithromycin (ZITHROMAX) tablet 1,000 mg (1,000 mg Oral Given 08/14/19 1420)     Initial Impression / Assessment and Plan / ED Course  I have reviewed the triage vital signs and the nursing notes.  Pertinent labs & imaging results that were available during my care of the patient were reviewed by me and considered in my medical decision making (see chart for details).  18 year old male presents with penile discharge concern for STD.  Vital signs are normal.  He is well-appearing.  Will obtain full STD testing.  UA from 4 days ago was concerning for infection with over 50 white blood cells however STD testing was not sent at that time.  Will treat empirically with azithromycin and Rocephin. Strongly encouraged patient to refrain from having intercourse for  2 weeks.  Final Clinical Impressions(s) / ED Diagnoses   Final diagnoses:  Penile discharge    ED Discharge Orders    None       Recardo Evangelist, PA-C 08/14/19 1426    Davonna Belling, MD 08/14/19 [phone]

## 2019-08-14 NOTE — Discharge Instructions (Signed)
Do not have sex for 2 weeks °Have all partners tested and treated °If your test is abnormal, you will be called but you have been treated for Gonorrhea and Chlamydia today. You can also review your results on MyChart °Practice safe sex and use a condom to prevent infection or unwanted pregnancy °Follow up with the Health Department ° °

## 2019-08-15 LAB — GC/CHLAMYDIA PROBE AMP (~~LOC~~) NOT AT ARMC
Chlamydia: POSITIVE — AB
Neisseria Gonorrhea: POSITIVE — AB

## 2019-08-15 LAB — RPR: RPR Ser Ql: NONREACTIVE

## 2019-10-21 ENCOUNTER — Ambulatory Visit (HOSPITAL_COMMUNITY)
Admission: EM | Admit: 2019-10-21 | Discharge: 2019-10-21 | Disposition: A | Discharge status: Home / Self Care | Payer: Medicaid Other | Attending: Family Medicine | Admitting: Family Medicine

## 2019-10-21 ENCOUNTER — Other Ambulatory Visit: Payer: Self-pay

## 2019-10-21 ENCOUNTER — Encounter (HOSPITAL_COMMUNITY): Payer: Self-pay | Admitting: Family Medicine

## 2019-10-21 DIAGNOSIS — Z202 Contact with and (suspected) exposure to infections with a predominantly sexual mode of transmission: Secondary | ICD-10-CM

## 2019-10-21 DIAGNOSIS — Z8619 Personal history of other infectious and parasitic diseases: Secondary | ICD-10-CM | POA: Diagnosis not present

## 2019-10-21 MED ORDER — LIDOCAINE HCL (PF) 1 % IJ SOLN
INTRAMUSCULAR | Status: AC
  Filled 2019-10-21: qty 2

## 2019-10-21 MED ORDER — CEFTRIAXONE SODIUM 250 MG IJ SOLR
250.0000 mg | Freq: Once | INTRAMUSCULAR | Status: AC
  Administered 2019-10-21: 16:00:00 250 mg via INTRAMUSCULAR

## 2019-10-21 MED ORDER — AZITHROMYCIN 250 MG PO TABS
1000.0000 mg | ORAL_TABLET | Freq: Once | ORAL | Status: AC
  Administered 2019-10-21: 16:00:00 1000 mg via ORAL

## 2019-10-21 MED ORDER — AZITHROMYCIN 250 MG PO TABS
ORAL_TABLET | ORAL | Status: AC
  Filled 2019-10-21: qty 4

## 2019-10-21 MED ORDER — CEFTRIAXONE SODIUM 250 MG IJ SOLR
INTRAMUSCULAR | Status: AC
  Filled 2019-10-21: qty 250

## 2019-10-21 NOTE — Discharge Instructions (Addendum)
Avoid any sexual encounters for at least a week to let the medicine we gave you today work.  Avoid sexual encounters with partner until they prove they have been treated.

## 2019-10-21 NOTE — ED Triage Notes (Signed)
Pt presents with penile discharge and and burning X 3 days.

## 2019-10-21 NOTE — ED Provider Notes (Signed)
Corey [name]    CSN: [id number] Arrival date & time: 10/21/19  1458      History   Chief Complaint Chief Complaint  Patient presents with  . STD Testing    HPI Corey [name] is a 18 y.o. male.   This is the initial visit for this 18 year old man who presents with penile discharge.  He was seen just 2 months ago with gonorrhea and chlamydia tested positive.  Patient has 3 days of dysuria and 1 day of watery discharge.  His girlfriend went in to get STD testing yesterday at the emergency department, but apparently left before she was seen.     History reviewed. No pertinent past medical history.  There are no active problems to display for this patient.   History reviewed. No pertinent surgical history.     Home Medications    Prior to Admission medications   Not on File    Family History Family History  Family history unknown: Yes    Social History Social History   Tobacco Use  . Smoking status: Current Every Day Smoker  . Smokeless tobacco: Never Used  Substance Use Topics  . Alcohol use: No  . Drug use: Not Currently     Allergies   Patient has no known allergies.   Review of Systems Review of Systems  Constitutional: Negative.   Genitourinary: Positive for discharge.  All other systems reviewed and are negative.    Physical Exam Triage Vital Signs ED Triage Vitals  Enc Vitals Group     BP      Pulse      Resp      Temp      Temp src      SpO2      Weight      Height      Head Circumference      Peak Flow      Pain Score      Pain Loc      Pain Edu?      Excl. in [place]?    No data found.  Updated Vital Signs BP 137/71 (BP Location: Right Arm)   Pulse 67   Temp 98.7 F (37.1 C) (Oral)   Resp 17   SpO2 98%    Physical Exam Vitals signs and nursing note reviewed.  Constitutional:      Appearance: Normal appearance. He is normal weight.  Eyes:     Conjunctiva/sclera: Conjunctivae normal.  Neck:   Musculoskeletal: Normal range of motion and neck supple.  Cardiovascular:     Rate and Rhythm: Normal rate.  Pulmonary:     Effort: Pulmonary effort is normal.  Genitourinary:    Comments: Watery penile discharge and circumcised male Musculoskeletal: Normal range of motion.  Skin:    General: Skin is warm.  Neurological:     General: No focal deficit present.     Mental Status: He is alert.  Psychiatric:        Mood and Affect: Mood normal.      UC Treatments / Results  Labs (all labs ordered are listed, but only abnormal results are displayed) Labs Reviewed  CYTOLOGY, (ORAL, ANAL, URETHRAL) ANCILLARY ONLY    EKG   Radiology No results found.  Procedures Procedures (including critical care time)  Medications Ordered in UC Medications  cefTRIAXone (ROCEPHIN) injection 250 mg (has no administration in time range)  azithromycin (ZITHROMAX) tablet 1,000 mg (has no administration in time range)    Initial Impression /  Assessment and Plan / UC Course  I have reviewed the triage vital signs and the nursing notes.  Pertinent labs & imaging results that were available during my care of the patient were reviewed by me and considered in my medical decision making (see chart for details).    Final Clinical Impressions(s) / UC Diagnoses   Final diagnoses:  STD exposure     Discharge Instructions     Avoid any sexual encounters for at least a week to let the medicine we gave you today work.  Avoid sexual encounters with partner until they prove they have been treated.    ED Prescriptions    None     I have reviewed the PDMP during this encounter.   Elvina Sidle, MD 10/21/19 [phone]

## 2019-10-23 LAB — CYTOLOGY, (ORAL, ANAL, URETHRAL) ANCILLARY ONLY
Chlamydia: NEGATIVE
Neisseria Gonorrhea: NEGATIVE
Trichomonas: POSITIVE — AB

## 2019-10-24 ENCOUNTER — Telehealth (HOSPITAL_COMMUNITY): Payer: Self-pay | Admitting: Emergency Medicine

## 2019-10-24 MED ORDER — METRONIDAZOLE 500 MG PO TABS: 2000.0000 mg | ORAL_TABLET | Freq: Once | ORAL | 0 refills | Status: AC

## 2019-10-24 NOTE — Telephone Encounter (Signed)
Trichomonas is positive. Rx  for Flagyl 2 grams, once was sent to the pharmacy of record. Pt needs education to refrain from sexual intercourse for 7 days to give the medicine time to work. Sexual partners need to be notified and tested/treated. Condoms may reduce risk of reinfection. Recheck for further evaluation if symptoms are not improving.   Patient contacted by phone and made aware of    results. Pt verbalized understanding and had all questions answered.    

## 2020-05-12 ENCOUNTER — Ambulatory Visit
Admission: RE | Admit: 2020-05-12 | Discharge: 2020-05-12 | Disposition: A | Discharge status: Home / Self Care | Payer: Medicaid Other | Source: Ambulatory Visit | Attending: Physician Assistant | Admitting: Physician Assistant

## 2020-05-12 ENCOUNTER — Other Ambulatory Visit: Payer: Self-pay

## 2020-05-12 ENCOUNTER — Ambulatory Visit: Payer: Self-pay

## 2020-05-12 VITALS — BP 131/80 | HR 93 | Temp 98.3°F | Resp 16

## 2020-05-12 DIAGNOSIS — Z113 Encounter for screening for infections with a predominantly sexual mode of transmission: Secondary | ICD-10-CM | POA: Diagnosis not present

## 2020-05-12 DIAGNOSIS — R369 Urethral discharge, unspecified: Secondary | ICD-10-CM

## 2020-05-12 NOTE — ED Triage Notes (Signed)
Pt presents to [facility] for 1 week of clear penile discharge, and occasional pain while urinating.  Wants to be tested for everything STI related.

## 2020-05-12 NOTE — ED Notes (Signed)
Patient able to ambulate independently  

## 2020-05-12 NOTE — ED Provider Notes (Signed)
[facility]    CSN: [id number] Arrival date & time: 05/12/20  1649      History   Chief Complaint Chief Complaint  Patient presents with  . Exposure to STD    HPI Corey [name] is a 19 y.o. male.   19 year old male comes in for 1 week history of penile discharge. Discharge is clear. Occasional dysuria. Denies fever. Denies penile sore/ulcer, testicular swelling, testicular pain. Sexually active with one male partner, no condom use.       History reviewed. No pertinent past medical history.  There are no problems to display for this patient.   History reviewed. No pertinent surgical history.     Home Medications    Prior to Admission medications   Not on File    Family History Family History  Problem Relation Age of Onset  . Healthy Mother   . Healthy Father     Social History Social History   Tobacco Use  . Smoking status: Former Smoker    Quit date: 12/2019    Years since quitting: 0.4  . Smokeless tobacco: Never Used  Substance Use Topics  . Alcohol use: No  . Drug use: Not Currently     Allergies   Patient has no known allergies.   Review of Systems Review of Systems  Reason unable to perform ROS: See HPI as above.     Physical Exam Triage Vital Signs ED Triage Vitals  Enc Vitals Group     BP 05/12/20 1656 131/80     Pulse Rate 05/12/20 1656 93     Resp 05/12/20 1656 16     Temp 05/12/20 1656 98.3 F (36.8 C)     Temp Source 05/12/20 1656 Oral     SpO2 05/12/20 1656 95 %     Weight --      Height --      Head Circumference --      Peak Flow --      Pain Score 05/12/20 1704 6     Pain Loc --      Pain Edu? --      Excl. in [place]? --    No data found.  Updated Vital Signs BP 131/80 (BP Location: Left Arm)   Pulse 93   Temp 98.3 F (36.8 C) (Oral)   Resp 16   SpO2 95%   Visual Acuity Right Eye Distance:   Left Eye Distance:   Bilateral Distance:    Right Eye Near:   Left Eye Near:    Bilateral  Near:     Physical Exam Constitutional:      General: He is not in acute distress.    Appearance: Normal appearance. He is well-developed. He is not toxic-appearing or diaphoretic.  HENT:     Head: Normocephalic and atraumatic.  Eyes:     Conjunctiva/sclera: Conjunctivae normal.     Pupils: Pupils are equal, round, and reactive to light.  Pulmonary:     Effort: Pulmonary effort is normal. No respiratory distress.     Comments: Speaking in full sentences without difficulty Musculoskeletal:     Cervical back: Normal range of motion and neck supple.  Skin:    General: Skin is warm and dry.  Neurological:     Mental Status: He is alert and oriented to person, place, and time.      UC Treatments / Results  Labs (all labs ordered are listed, but only abnormal results are displayed) Labs Reviewed  HIV  ANTIBODY (ROUTINE TESTING W REFLEX)  RPR  CYTOLOGY, (ORAL, ANAL, URETHRAL) ANCILLARY ONLY    EKG   Radiology No results found.  Procedures Procedures (including critical care time)  Medications Ordered in UC Medications - No data to display  Initial Impression / Assessment and Plan / UC Course  I have reviewed the triage vital signs and the nursing notes.  Pertinent labs & imaging results that were available during my care of the patient were reviewed by me and considered in my medical decision making (see chart for details).    Patient also requesting RPR/HIV. Testing sent, patient will be contacted with any positive results that require additional treatment. Patient to refrain from sexual activity for the next 7 days. Return precautions given.   Final Clinical Impressions(s) / UC Diagnoses   Final diagnoses:  Screen for STD (sexually transmitted disease)  Penile discharge   ED Prescriptions    None     PDMP not reviewed this encounter.   Belinda Fisher, PA-C 05/12/20 1738

## 2020-05-12 NOTE — Discharge Instructions (Signed)
Lab testing sent, you will be contacted with any positive results that requires further treatment. Refrain from sexual activity until results return. If developing testicular swelling/pain, penile lesion/sore, follow up for reevaluation.

## 2020-05-14 LAB — RPR: RPR Ser Ql: NONREACTIVE

## 2020-05-14 LAB — CYTOLOGY, (ORAL, ANAL, URETHRAL) ANCILLARY ONLY
Chlamydia: NEGATIVE
Comment: NEGATIVE
Comment: NEGATIVE
Comment: NORMAL
Neisseria Gonorrhea: NEGATIVE
Trichomonas: POSITIVE — AB

## 2020-05-14 LAB — HIV ANTIBODY (ROUTINE TESTING W REFLEX): HIV Screen 4th Generation wRfx: NONREACTIVE

## 2020-05-17 ENCOUNTER — Telehealth (HOSPITAL_COMMUNITY): Payer: Self-pay

## 2020-05-17 MED ORDER — METRONIDAZOLE 500 MG PO TABS: 500.0000 mg | ORAL_TABLET | Freq: Two times a day (BID) | ORAL | 0 refills | Status: DC

## 2020-06-09 ENCOUNTER — Ambulatory Visit
Admission: RE | Admit: 2020-06-09 | Discharge: 2020-06-09 | Disposition: A | Discharge status: Home / Self Care | Payer: Medicaid Other | Source: Ambulatory Visit | Attending: Physician Assistant | Admitting: Physician Assistant

## 2020-06-09 ENCOUNTER — Ambulatory Visit (INDEPENDENT_AMBULATORY_CARE_PROVIDER_SITE_OTHER): Payer: Medicaid Other

## 2020-06-09 ENCOUNTER — Other Ambulatory Visit: Payer: Self-pay

## 2020-06-09 VITALS — BP 147/87 | HR 59 | Temp 98.2°F | Resp 18

## 2020-06-09 DIAGNOSIS — S62336A Displaced fracture of neck of fifth metacarpal bone, right hand, initial encounter for closed fracture: Secondary | ICD-10-CM

## 2020-06-09 DIAGNOSIS — W208XXA Other cause of strike by thrown, projected or falling object, initial encounter: Secondary | ICD-10-CM | POA: Diagnosis not present

## 2020-06-09 DIAGNOSIS — M79641 Pain in right hand: Secondary | ICD-10-CM | POA: Diagnosis not present

## 2020-06-09 MED ORDER — TRAMADOL HCL 50 MG PO TABS: 50.0000 mg | ORAL_TABLET | Freq: Four times a day (QID) | ORAL | 0 refills | Status: DC | PRN

## 2020-06-09 NOTE — Discharge Instructions (Signed)
Splint applied. Ibuprofen 800mg  three times a day, tylenol 1000mg  three times a day. Can add tramadol as needed. Ice compress, rest. Follow up with hand orthopedics for further evaluation needed.
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
## 2020-07-11 ENCOUNTER — Ambulatory Visit
Admission: RE | Admit: 2020-07-11 | Discharge: 2020-07-11 | Disposition: A | Discharge status: Home / Self Care | Payer: Medicaid Other | Source: Ambulatory Visit | Attending: Emergency Medicine | Admitting: Emergency Medicine

## 2020-07-11 ENCOUNTER — Other Ambulatory Visit: Payer: Self-pay

## 2020-07-11 VITALS — BP 137/83 | HR 80 | Temp 98.1°F | Resp 18

## 2020-07-11 DIAGNOSIS — R360 Urethral discharge without blood: Secondary | ICD-10-CM | POA: Insufficient documentation

## 2020-07-11 MED ORDER — CEFTRIAXONE SODIUM 500 MG IJ SOLR
500.0000 mg | Freq: Once | INTRAMUSCULAR | Status: AC
  Administered 2020-07-11: 500 mg via INTRAMUSCULAR

## 2020-07-11 MED ORDER — DOXYCYCLINE HYCLATE 100 MG PO CAPS
100.0000 mg | ORAL_CAPSULE | Freq: Two times a day (BID) | ORAL | 0 refills | Status: AC
Start: 2020-07-11 — End: 2020-07-18

## 2020-07-11 NOTE — Discharge Instructions (Addendum)
Today you received treatment for chlamydia, gonorrhea. Testing for chlamydia, gonorrhea, trichomonas is pending: please look for these results on the MyChart app/website.  We will notify you if you are positive and outline treatment at that time.  Important to avoid all forms of sexual intercourse (oral, vaginal, anal) with any/all partners for the next 7 days to avoid spreading/reinfecting. Any/all sexual partners should be notified of testing/treatment today.  Return for persistent/worsening symptoms or if you develop fever, abdominal or pelvic pain, discharge, genital pain, blood in your urine, or are re-exposed to an STI. 

## 2020-07-11 NOTE — ED Provider Notes (Signed)
[facility]    CSN: [id number] Arrival date & time: 07/11/20  1202      History   Chief Complaint Chief Complaint  Patient presents with  . Appointment    1200  . Exposure to STD    HPI Lando Alcalde is a 19 y.o. male Presenting for STD testing.  Endorsing penile discharge for the last 2 days.  No penile or testicular pain, swelling.  Does note dysuria without urgency or frequency.  Is sexually active, not routinely using condoms.  No known exposures.  History reviewed. No pertinent past medical history.  There are no problems to display for this patient.   History reviewed. No pertinent surgical history.     Home Medications    Prior to Admission medications   Medication Sig Start Date End Date Taking? Authorizing Provider  doxycycline (VIBRAMYCIN) 100 MG capsule Take 1 capsule (100 mg total) by mouth 2 (two) times daily for 7 days. 07/11/20 07/18/20  Hall-Potvin, [place], PA-C  traMADol (ULTRAM) 50 MG tablet Take 1 tablet (50 mg total) by mouth every 6 (six) hours as needed. 06/09/20   Belinda Fisher, PA-C    Family History Family History  Problem Relation Age of Onset  . Healthy Mother   . Healthy Father     Social History Social History   Tobacco Use  . Smoking status: Former Smoker    Quit date: 12/2019    Years since quitting: 0.5  . Smokeless tobacco: Never Used  Substance Use Topics  . Alcohol use: No  . Drug use: Not Currently     Allergies   Patient has no known allergies.   Review of Systems As per HPI   Physical Exam Triage Vital Signs ED Triage Vitals  Enc Vitals Group     BP 07/11/20 1214 137/83     Pulse Rate 07/11/20 1214 80     Resp 07/11/20 1214 18     Temp 07/11/20 1214 98.1 F (36.7 C)     Temp Source 07/11/20 1214 Oral     SpO2 07/11/20 1214 97 %     Weight --      Height --      Head Circumference --      Peak Flow --      Pain Score 07/11/20 1229 0     Pain Loc --      Pain Edu? --      Excl. in [place]? --      No data found.  Updated Vital Signs BP 137/83 (BP Location: Right Arm)   Pulse 80   Temp 98.1 F (36.7 C) (Oral)   Resp 18   SpO2 97%   Visual Acuity Right Eye Distance:   Left Eye Distance:   Bilateral Distance:    Right Eye Near:   Left Eye Near:    Bilateral Near:     Physical Exam Constitutional:      General: He is not in acute distress. HENT:     Head: Normocephalic and atraumatic.  Eyes:     General: No scleral icterus.    Pupils: Pupils are equal, round, and reactive to light.  Cardiovascular:     Rate and Rhythm: Normal rate.  Pulmonary:     Effort: Pulmonary effort is normal. No respiratory distress.     Breath sounds: No wheezing.  Genitourinary:    Comments: Patient declined, self swab performed. Skin:    Coloration: Skin is not jaundiced or pale.  Neurological:  Mental Status: He is alert and oriented to person, place, and time.      UC Treatments / Results  Labs (all labs ordered are listed, but only abnormal results are displayed) Labs Reviewed  CYTOLOGY, (ORAL, ANAL, URETHRAL) ANCILLARY ONLY    EKG   Radiology No results found.  Procedures Procedures (including critical care time)  Medications Ordered in UC Medications  cefTRIAXone (ROCEPHIN) injection 500 mg (has no administration in time range)    Initial Impression / Assessment and Plan / UC Course  I have reviewed the triage vital signs and the nursing notes.  Pertinent labs & imaging results that were available during my care of the patient were reviewed by me and considered in my medical decision making (see chart for details).     Cytology pending, will treat for G/C as outlined below.  Given Rocephin in office which he tolerated well.  Return precautions discussed, pt verbalized understanding and is agreeable to plan. Final Clinical Impressions(s) / UC Diagnoses   Final diagnoses:  Penile discharge, without blood     Discharge Instructions     Today you  received treatment for chlamydia, gonorrhea. Testing for chlamydia, gonorrhea, trichomonas is pending: please look for these results on the MyChart app/website.  We will notify you if you are positive and outline treatment at that time.  Important to avoid all forms of sexual intercourse (oral, vaginal, anal) with any/all partners for the next 7 days to avoid spreading/reinfecting. Any/all sexual partners should be notified of testing/treatment today.  Return for persistent/worsening symptoms or if you develop fever, abdominal or pelvic pain, discharge, genital pain, blood in your urine, or are re-exposed to an STI.    ED Prescriptions    Medication Sig Dispense Auth. Provider   doxycycline (VIBRAMYCIN) 100 MG capsule Take 1 capsule (100 mg total) by mouth 2 (two) times daily for 7 days. 14 capsule Hall-Potvin, [place], PA-C     PDMP not reviewed this encounter.   Hall-Potvin, [place], [place] 07/11/20 1306

## 2020-07-11 NOTE — ED Triage Notes (Signed)
Pt here for penile discharge and STD testing

## 2020-07-13 LAB — CYTOLOGY, (ORAL, ANAL, URETHRAL) ANCILLARY ONLY
Chlamydia: POSITIVE — AB
Comment: NEGATIVE
Comment: NEGATIVE
Comment: NORMAL
Neisseria Gonorrhea: NEGATIVE
Trichomonas: NEGATIVE

## 2020-08-20 ENCOUNTER — Ambulatory Visit
Admission: RE | Admit: 2020-08-20 | Discharge: 2020-08-20 | Disposition: A | Discharge status: Home / Self Care | Payer: Medicaid Other | Source: Ambulatory Visit | Attending: Emergency Medicine | Admitting: Emergency Medicine

## 2020-08-20 ENCOUNTER — Other Ambulatory Visit: Payer: Self-pay

## 2020-08-20 VITALS — BP 138/85 | HR 85 | Temp 98.0°F | Resp 17

## 2020-08-20 DIAGNOSIS — R369 Urethral discharge, unspecified: Secondary | ICD-10-CM | POA: Insufficient documentation

## 2020-08-20 DIAGNOSIS — Z7251 High risk heterosexual behavior: Secondary | ICD-10-CM | POA: Diagnosis not present

## 2020-08-20 DIAGNOSIS — R300 Dysuria: Secondary | ICD-10-CM | POA: Diagnosis present

## 2020-08-20 DIAGNOSIS — Z8619 Personal history of other infectious and parasitic diseases: Secondary | ICD-10-CM

## 2020-08-20 MED ORDER — DOXYCYCLINE HYCLATE 100 MG PO CAPS
100.0000 mg | ORAL_CAPSULE | Freq: Two times a day (BID) | ORAL | 0 refills | Status: AC
Start: 2020-08-20 — End: 2020-08-27

## 2020-08-20 NOTE — ED Triage Notes (Signed)
Pt complains of discharge and painful urination after recent treatment for STIs. Pt is aox4 and ambulatory.

## 2020-08-20 NOTE — ED Provider Notes (Signed)
[facility]    CSN: [id number] Arrival date & time: 08/20/20  1758      History   Chief Complaint Chief Complaint  Patient presents with  . SEXUALLY TRANSMITTED DISEASE    discharge and burning urination x 2 days    HPI Corey [name] is a 19 y.o. male  Presenting for penile discharge and pain with urination.  States this has been happening since he was treated for STI in September.  States he completed treatment, though had unprotected intercourse with the same sexual partner within a week of being treated.  States symptoms began about 3 days ago.  Did have complete resolution after initial treatment.  Endorsing sex with 1 male partner, not using condoms.  History reviewed. No pertinent past medical history.  There are no problems to display for this patient.   History reviewed. No pertinent surgical history.     Home Medications    Prior to Admission medications   Medication Sig Start Date End Date Taking? Authorizing Provider  doxycycline (VIBRAMYCIN) 100 MG capsule Take 1 capsule (100 mg total) by mouth 2 (two) times daily for 7 days. 08/20/20 08/27/20  Hall-Potvin, [place], PA-C    Family History Family History  Problem Relation Age of Onset  . Healthy Mother   . Healthy Father     Social History Social History   Tobacco Use  . Smoking status: Former Smoker    Quit date: 12/2019    Years since quitting: 0.6  . Smokeless tobacco: Never Used  Vaping Use  . Vaping Use: Never used  Substance Use Topics  . Alcohol use: No  . Drug use: Not Currently     Allergies   Patient has no known allergies.   Review of Systems As per HPI   Physical Exam Triage Vital Signs ED Triage Vitals  Enc Vitals Group     BP 08/20/20 1806 138/85     Pulse Rate 08/20/20 1806 85     Resp 08/20/20 1806 17     Temp 08/20/20 1806 98 F (36.7 C)     Temp Source 08/20/20 1806 Oral     SpO2 08/20/20 1806 97 %     Weight --      Height --      Head  Circumference --      Peak Flow --      Pain Score 08/20/20 1807 4     Pain Loc --      Pain Edu? --      Excl. in GC? --    No data found.  Updated Vital Signs BP 138/85 (BP Location: Left Arm)   Pulse 85   Temp 98 F (36.7 C) (Oral)   Resp 17   SpO2 97%   Visual Acuity Right Eye Distance:   Left Eye Distance:   Bilateral Distance:    Right Eye Near:   Left Eye Near:    Bilateral Near:     Physical Exam Constitutional:      General: He is not in acute distress. HENT:     Head: Normocephalic and atraumatic.  Eyes:     General: No scleral icterus.    Pupils: Pupils are equal, round, and reactive to light.  Cardiovascular:     Rate and Rhythm: Normal rate.  Pulmonary:     Effort: Pulmonary effort is normal. No respiratory distress.     Breath sounds: No wheezing.  Skin:    Coloration: Skin is not jaundiced or  pale.  Neurological:     Mental Status: He is alert and oriented to person, place, and time.      UC Treatments / Results  Labs (all labs ordered are listed, but only abnormal results are displayed) Labs Reviewed  CYTOLOGY, (ORAL, ANAL, URETHRAL) ANCILLARY ONLY    EKG   Radiology No results found.  Procedures Procedures (including critical care time)  Medications Ordered in UC Medications - No data to display  Initial Impression / Assessment and Plan / UC Course  I have reviewed the triage vital signs and the nursing notes.  Pertinent labs & imaging results that were available during my care of the patient were reviewed by me and considered in my medical decision making (see chart for details).     Cytology pending.  Will cover empirically for chlamydia given discharge w/ recent h/o positive chlamydia.  Return precautions discussed, pt verbalized understanding and is agreeable to plan. Final Clinical Impressions(s) / UC Diagnoses   Final diagnoses:  History of chlamydia  Penile discharge  Unprotected sex  Dysuria     Discharge  Instructions     Today you received treatment for chlamydia. Testing for chlamydia, gonorrhea, trichomonas is pending: please look for these results on the MyChart app/website.  We will notify you if you are positive and outline treatment at that time.  Important to avoid all forms of sexual intercourse (oral, vaginal, anal) with any/all partners for the next 7 days to avoid spreading/reinfecting. Any/all sexual partners should be notified of testing/treatment today.  Return for persistent/worsening symptoms or if you develop fever, abdominal or pelvic pain, discharge, genital pain, blood in your urine, or are re-exposed to an STI.    ED Prescriptions    Medication Sig Dispense Auth. Provider   doxycycline (VIBRAMYCIN) 100 MG capsule Take 1 capsule (100 mg total) by mouth 2 (two) times daily for 7 days. 14 capsule Hall-Potvin, [place], PA-C     PDMP not reviewed this encounter.   Hall-Potvin, [place], [place] 08/20/20 1847

## 2020-08-20 NOTE — Discharge Instructions (Addendum)
Today you received treatment for chlamydia. Testing for chlamydia, gonorrhea, trichomonas is pending: please look for these results on the MyChart app/website.  We will notify you if you are positive and outline treatment at that time.  Important to avoid all forms of sexual intercourse (oral, vaginal, anal) with any/all partners for the next 7 days to avoid spreading/reinfecting. Any/all sexual partners should be notified of testing/treatment today.  Return for persistent/worsening symptoms or if you develop fever, abdominal or pelvic pain, discharge, genital pain, blood in your urine, or are re-exposed to an STI. 

## 2020-08-23 LAB — CYTOLOGY, (ORAL, ANAL, URETHRAL) ANCILLARY ONLY
Chlamydia: NEGATIVE
Comment: NEGATIVE
Comment: NEGATIVE
Comment: NORMAL
Neisseria Gonorrhea: NEGATIVE
Trichomonas: NEGATIVE

## 2020-10-18 ENCOUNTER — Ambulatory Visit
Admission: EM | Admit: 2020-10-18 | Discharge: 2020-10-18 | Discharge status: Against Medical Advice | Payer: Medicaid Other

## 2020-10-18 ENCOUNTER — Ambulatory Visit: Payer: Self-pay

## 2020-10-18 NOTE — ED Notes (Signed)
Called at 1914 and made aware room ready

## 2020-10-19 ENCOUNTER — Ambulatory Visit
Admission: EM | Admit: 2020-10-19 | Discharge: 2020-10-19 | Disposition: A | Discharge status: Home / Self Care | Payer: Medicaid Other | Attending: Emergency Medicine | Admitting: Emergency Medicine

## 2020-10-19 ENCOUNTER — Other Ambulatory Visit: Payer: Self-pay

## 2020-10-19 VITALS — BP 148/92 | HR 54 | Temp 98.5°F | Resp 18

## 2020-10-19 DIAGNOSIS — R369 Urethral discharge, unspecified: Secondary | ICD-10-CM | POA: Diagnosis not present

## 2020-10-19 DIAGNOSIS — R300 Dysuria: Secondary | ICD-10-CM

## 2020-10-19 MED ORDER — CEFTRIAXONE SODIUM 500 MG IJ SOLR
500.0000 mg | Freq: Once | INTRAMUSCULAR | Status: AC
  Administered 2020-10-19: 500 mg via INTRAMUSCULAR

## 2020-10-19 NOTE — ED Provider Notes (Signed)
[facility]    CSN: [id number] Arrival date & time: 10/19/20  1558      History   Chief Complaint Chief Complaint  Patient presents with  . Appointment    1600  . Exposure to STD    HPI Corey [name] is a 19 y.o. male presenting today for evaluation of dysuria and penile discharge.  Patient reports over the past 3 days he has had burning at the end of his urination stream along with penile discharge.  Patient reports that this started after he had intercourse with multiple different partners.  He does report also that he has noticed some small bumps along the shaft of his penis.  Denies associated itching or pain with this.  He is unsure how long these have been there.  HPI  History reviewed. No pertinent past medical history.  There are no problems to display for this patient.   History reviewed. No pertinent surgical history.     Home Medications    Prior to Admission medications   Not on File    Family History Family History  Problem Relation Age of Onset  . Healthy Mother   . Healthy Father     Social History Social History   Tobacco Use  . Smoking status: Former Smoker    Quit date: 12/2019    Years since quitting: 0.8  . Smokeless tobacco: Never Used  Vaping Use  . Vaping Use: Never used  Substance Use Topics  . Alcohol use: No  . Drug use: Not Currently     Allergies   Patient has no known allergies.   Review of Systems Review of Systems  Constitutional: Negative for fever.  HENT: Negative for sore throat.   Respiratory: Negative for shortness of breath.   Cardiovascular: Negative for chest pain.  Gastrointestinal: Negative for abdominal pain, nausea and vomiting.  Genitourinary: Positive for discharge and dysuria. Negative for difficulty urinating, frequency, penile pain, penile swelling, scrotal swelling and testicular pain.  Skin: Negative for rash.  Neurological: Negative for dizziness, light-headedness and headaches.       Physical Exam Triage Vital Signs ED Triage Vitals  Enc Vitals Group     BP      Pulse      Resp      Temp      Temp src      SpO2      Weight      Height      Head Circumference      Peak Flow      Pain Score      Pain Loc      Pain Edu?      Excl. in GC?    No data found.  Updated Vital Signs BP (!) 148/92 (BP Location: Left Arm)   Pulse (!) 54   Temp 98.5 F (36.9 C) (Oral)   Resp 18   SpO2 98%   Visual Acuity Right Eye Distance:   Left Eye Distance:   Bilateral Distance:    Right Eye Near:   Left Eye Near:    Bilateral Near:     Physical Exam Vitals and nursing note reviewed.  Constitutional:      Appearance: He is well-developed.     Comments: No acute distress  HENT:     Head: Normocephalic and atraumatic.     Nose: Nose normal.  Eyes:     Conjunctiva/sclera: Conjunctivae normal.  Cardiovascular:     Rate and Rhythm: Normal rate.  Pulmonary:     Effort: Pulmonary effort is normal. No respiratory distress.  Abdominal:     General: There is no distension.  Genitourinary:    Comments: Few small skin colored papular lesions noted to shaft of penis, no vesicles, nontender to touch, no erythema, urethral meatus without discharge noted Musculoskeletal:        General: Normal range of motion.     Cervical back: Neck supple.  Skin:    General: Skin is warm and dry.  Neurological:     Mental Status: He is alert and oriented to person, place, and time.      UC Treatments / Results  Labs (all labs ordered are listed, but only abnormal results are displayed) Labs Reviewed  CYTOLOGY, (ORAL, ANAL, URETHRAL) ANCILLARY ONLY    EKG   Radiology No results found.  Procedures Procedures (including critical care time)  Medications Ordered in UC Medications  cefTRIAXone (ROCEPHIN) injection 500 mg (500 mg Intramuscular Given 10/19/20 1637)    Initial Impression / Assessment and Plan / UC Course  I have reviewed the triage vital signs and  the nursing notes.  Pertinent labs & imaging results that were available during my care of the patient were reviewed by me and considered in my medical decision making (see chart for details).     Given patient's reported symptoms of discharge and dysuria did opt to go ahead and empirically treat for gonorrhea and chlamydia as he has had multiple partners.  Provided Rocephin prior to discharge and doxycycline.  Patient was seen here 2 months ago and had negative swab, discussed if patient swab returns negative this time we will not be able to continue to treat him empirically.  Patient verbalized understanding.  Discussed strict return precautions. Patient verbalized understanding and is agreeable with plan.  Final Clinical Impressions(s) / UC Diagnoses   Final diagnoses:  Penile discharge  Dysuria     Discharge Instructions     We have treated you today for gonorrhea, with rocephin. Pick up doxycycline from the pharmacy to treat chlamydia. Please refrain from sexual activity for 10 days while medicine is clearing infection.  We are testing you for Gonorrhea, Chlamydia and Trichomonas. We will call you if anything is positive and let you know if you require any further treatment. Please inform partner of any positive results.  Please return if symptoms not improving with treatment, development of fever, nausea, vomiting, abdominal pain, scrotal pain.    ED Prescriptions    None     PDMP not reviewed this encounter.   Lew Dawes, [place] 10/19/20 1657

## 2020-10-19 NOTE — ED Triage Notes (Signed)
Pt sts penile discharge and burning; pt requests STD testing

## 2020-10-19 NOTE — Discharge Instructions (Signed)
We have treated you today for gonorrhea, with rocephin. Pick up doxycycline from the pharmacy to treat chlamydia. Please refrain from sexual activity for 10 days while medicine is clearing infection.  We are testing you for Gonorrhea, Chlamydia and Trichomonas. We will call you if anything is positive and let you know if you require any further treatment. Please inform partner of any positive results.  Please return if symptoms not improving with treatment, development of fever, nausea, vomiting, abdominal pain, scrotal pain.

## 2020-10-21 LAB — CYTOLOGY, (ORAL, ANAL, URETHRAL) ANCILLARY ONLY
Chlamydia: NEGATIVE
Comment: NEGATIVE
Comment: NEGATIVE
Comment: NORMAL
Neisseria Gonorrhea: NEGATIVE
Trichomonas: NEGATIVE

## 2021-07-11 ENCOUNTER — Ambulatory Visit: Payer: Self-pay

## 2021-08-25 ENCOUNTER — Other Ambulatory Visit: Payer: Self-pay

## 2021-08-25 ENCOUNTER — Ambulatory Visit
Admission: EM | Admit: 2021-08-25 | Discharge: 2021-08-25 | Disposition: A | Discharge status: Home / Self Care | Payer: Medicaid Other | Attending: Internal Medicine | Admitting: Internal Medicine

## 2021-08-25 DIAGNOSIS — R369 Urethral discharge, unspecified: Secondary | ICD-10-CM | POA: Diagnosis not present

## 2021-08-25 DIAGNOSIS — Z113 Encounter for screening for infections with a predominantly sexual mode of transmission: Secondary | ICD-10-CM | POA: Insufficient documentation

## 2021-08-25 DIAGNOSIS — R300 Dysuria: Secondary | ICD-10-CM | POA: Diagnosis not present

## 2021-08-25 LAB — POCT URINALYSIS DIP (MANUAL ENTRY)
Blood, UA: NEGATIVE
Glucose, UA: NEGATIVE mg/dL
Leukocytes, UA: NEGATIVE
Nitrite, UA: NEGATIVE
Protein Ur, POC: 100 mg/dL — AB
Spec Grav, UA: >=1.03 — AB (ref 1.010–1.025)
Urobilinogen, UA: 1 E.U./dL
pH, UA: 6 (ref 5.0–8.0)
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
## 2021-08-25 NOTE — Discharge Instructions (Addendum)
Your swab is pending.  Your urine did not show signs of urinary tract infection.  We will call with results and treat as appropriate.  Please refrain from sexual activity until test results or treatment are complete.

## 2021-08-25 NOTE — ED Provider Notes (Signed)
[facility]    CSN: [id number] Arrival date & time: 08/25/21  1934      History   Chief Complaint Chief Complaint  Patient presents with   penile drainage    HPI Corey [name] is a 20 y.o. male.   Patient presents with urinary burning and white penile discharge that started today.  Patient reports that he had a new sexual partner recently and he is concerned for STD.  Did not use protection during sexual intercourse.  Denies any urinary frequency, pelvic pain, abdominal pain, testicular pain, fever, back pain.    History reviewed. No pertinent past medical history.  There are no problems to display for this patient.   History reviewed. No pertinent surgical history.     Home Medications    Prior to Admission medications   Not on File    Family History Family History  Problem Relation Age of Onset   Healthy Mother    Healthy Father     Social History Social History   Tobacco Use   Smoking status: Former    Types: Cigarettes    Quit date: 12/2019    Years since quitting: 1.6   Smokeless tobacco: Never  Vaping Use   Vaping Use: Never used  Substance Use Topics   Alcohol use: No   Drug use: Not Currently     Allergies   Patient has no known allergies.   Review of Systems Review of Systems Per HPI  Physical Exam Triage Vital Signs ED Triage Vitals  Enc Vitals Group     BP 08/25/21 1940 131/85     Pulse Rate 08/25/21 1940 85     Resp 08/25/21 1940 20     Temp 08/25/21 1940 98 F (36.7 C)     Temp Source 08/25/21 1940 Oral     SpO2 08/25/21 1940 95 %     Weight --      Height --      Head Circumference --      Peak Flow --      Pain Score 08/25/21 1946 0     Pain Loc --      Pain Edu? --      Excl. in GC? --    No data found.  Updated Vital Signs BP 131/85 (BP Location: Left Arm)   Pulse 85   Temp 98 F (36.7 C) (Oral)   Resp 20   SpO2 95%   Visual Acuity Right Eye Distance:   Left Eye Distance:   Bilateral  Distance:    Right Eye Near:   Left Eye Near:    Bilateral Near:     Physical Exam Constitutional:      Appearance: Normal appearance.  HENT:     Head: Normocephalic and atraumatic.  Eyes:     Extraocular Movements: Extraocular movements intact.     Conjunctiva/sclera: Conjunctivae normal.  Pulmonary:     Effort: Pulmonary effort is normal.  Genitourinary:    Comments: Deferred with shared decision making.  Self swab performed. Neurological:     General: No focal deficit present.     Mental Status: He is alert and oriented to person, place, and time. Mental status is at baseline.  Psychiatric:        Mood and Affect: Mood normal.        Behavior: Behavior normal.        Thought Content: Thought content normal.        Judgment: Judgment normal.  UC Treatments / Results  Labs (all labs ordered are listed, but only abnormal results are displayed) Labs Reviewed  POCT URINALYSIS DIP (MANUAL ENTRY) - Abnormal; Notable for the following components:      Result Value   Color, UA other (*)    Bilirubin, UA small (*)    Ketones, POC UA trace (5) (*)    Spec Grav, UA >=1.030 (*)    Protein Ur, POC =100 (*)    All other components within normal limits  CYTOLOGY, (ORAL, ANAL, URETHRAL) ANCILLARY ONLY    EKG   Radiology No results found.  Procedures Procedures (including critical care time)  Medications Ordered in UC Medications - No data to display  Initial Impression / Assessment and Plan / UC Course  I have reviewed the triage vital signs and the nursing notes.  Pertinent labs & imaging results that were available during my care of the patient were reviewed by me and considered in my medical decision making (see chart for details).     Cytology swab pending.  Urinalysis not definitive for urinary tract infection.  Suspicious of STD given recent sexual encounter.  Will await results and treat as appropriate at that time.  No red flags on exam.Discussed strict  return precautions. Patient verbalized understanding and is agreeable with plan.  Final Clinical Impressions(s) / UC Diagnoses   Final diagnoses:  Penile discharge  Screening examination for venereal disease  Dysuria     Discharge Instructions      Your swab is pending.  Your urine did not show signs of urinary tract infection.  We will call with results and treat as appropriate.  Please refrain from sexual activity until test results or treatment are complete.     ED Prescriptions   None    PDMP not reviewed this encounter.   Lance Muss, FNP 08/25/21 2004

## 2021-08-29 LAB — CYTOLOGY, (ORAL, ANAL, URETHRAL) ANCILLARY ONLY
Chlamydia: NEGATIVE
Comment: NEGATIVE
Comment: NEGATIVE
Comment: NORMAL
Neisseria Gonorrhea: NEGATIVE
Trichomonas: NEGATIVE

## 2021-08-30 ENCOUNTER — Ambulatory Visit (HOSPITAL_COMMUNITY)
Admission: EM | Admit: 2021-08-30 | Discharge: 2021-08-30 | Disposition: A | Discharge status: Home / Self Care | Payer: Medicaid Other | Attending: Physician Assistant | Admitting: Physician Assistant

## 2021-08-30 ENCOUNTER — Other Ambulatory Visit: Payer: Self-pay

## 2021-08-30 ENCOUNTER — Encounter (HOSPITAL_COMMUNITY): Payer: Self-pay

## 2021-08-30 DIAGNOSIS — R300 Dysuria: Secondary | ICD-10-CM | POA: Diagnosis present

## 2021-08-30 DIAGNOSIS — Z113 Encounter for screening for infections with a predominantly sexual mode of transmission: Secondary | ICD-10-CM | POA: Diagnosis present

## 2021-08-30 DIAGNOSIS — Z711 Person with feared health complaint in whom no diagnosis is made: Secondary | ICD-10-CM | POA: Insufficient documentation

## 2021-08-30 DIAGNOSIS — R369 Urethral discharge, unspecified: Secondary | ICD-10-CM | POA: Insufficient documentation

## 2021-08-30 LAB — POCT URINALYSIS DIPSTICK, ED / UC
Bilirubin Urine: NEGATIVE
Glucose, UA: NEGATIVE mg/dL
Hgb urine dipstick: NEGATIVE
Leukocytes,Ua: NEGATIVE
Nitrite: NEGATIVE
Protein, ur: 30 mg/dL — AB
Specific Gravity, Urine: >=1.03 (ref 1.005–1.030)
Urobilinogen, UA: 0.2 mg/dL (ref 0.0–1.0)
pH: 5.5 (ref 5.0–8.0)

## 2021-08-30 MED ORDER — CEFTRIAXONE SODIUM 500 MG IJ SOLR
500.0000 mg | Freq: Once | INTRAMUSCULAR | Status: AC
  Administered 2021-08-30: 500 mg via INTRAMUSCULAR

## 2021-08-30 MED ORDER — CEFTRIAXONE SODIUM 500 MG IJ SOLR
INTRAMUSCULAR | Status: AC
  Filled 2021-08-30: qty 500

## 2021-08-30 NOTE — ED Provider Notes (Signed)
[facility]    CSN: [id number] Arrival date & time: 08/30/21  1702      History   Chief Complaint Chief Complaint  Patient presents with   SEXUALLY TRANSMITTED DISEASE   Penile Discharge    HPI Corey [name] is a 20 y.o. male.   Patient presents today with a 2-day history of dysuria and penile discharge.  He describes discharge as thick and yellow/green.  He denies any abdominal pain, pelvic pain, fever, nausea, vomiting.  He has not tried any over-the-counter medication for symptom management.  Denies any new sexual partners.  He is sexually active with women and does not consistently use condoms.  Denies any recent antibiotic use.   History reviewed. No pertinent past medical history.  There are no problems to display for this patient.   History reviewed. No pertinent surgical history.     Home Medications    Prior to Admission medications   Not on File    Family History Family History  Problem Relation Age of Onset   Healthy Mother    Healthy Father     Social History Social History   Tobacco Use   Smoking status: Former    Types: Cigarettes    Quit date: 12/2019    Years since quitting: 1.7   Smokeless tobacco: Never  Vaping Use   Vaping Use: Never used  Substance Use Topics   Alcohol use: No   Drug use: Not Currently     Allergies   Patient has no known allergies.   Review of Systems Review of Systems  Constitutional:  Negative for activity change, appetite change, fatigue and fever.  Respiratory:  Negative for cough and shortness of breath.   Cardiovascular:  Negative for chest pain.  Gastrointestinal:  Negative for abdominal pain, diarrhea, nausea and vomiting.  Genitourinary:  Positive for dysuria and penile discharge. Negative for frequency and urgency.  Musculoskeletal:  Negative for arthralgias and myalgias.  Neurological:  Negative for dizziness, light-headedness and headaches.    Physical Exam Triage Vital  Signs ED Triage Vitals [08/30/21 1749]  Enc Vitals Group     BP 127/85     Pulse Rate 91     Resp 19     Temp 98.1 F (36.7 C)     Temp Source Oral     SpO2 96 %     Weight      Height      Head Circumference      Peak Flow      Pain Score 10     Pain Loc      Pain Edu?      Excl. in GC?    No data found.  Updated Vital Signs BP 127/85 (BP Location: Right Arm)   Pulse 91   Temp 98.1 F (36.7 C) (Oral)   Resp 19   SpO2 96%   Visual Acuity Right Eye Distance:   Left Eye Distance:   Bilateral Distance:    Right Eye Near:   Left Eye Near:    Bilateral Near:     Physical Exam Vitals reviewed.  Constitutional:      General: He is awake.     Appearance: Normal appearance. He is well-developed. He is not ill-appearing.     Comments: Very pleasant male appears stated age in no acute distress sitting comfortably in exam room  HENT:     Head: Normocephalic and atraumatic.  Cardiovascular:     Rate and Rhythm: Normal rate and  regular rhythm.     Heart sounds: Normal heart sounds, S1 normal and S2 normal. No murmur heard. Pulmonary:     Effort: Pulmonary effort is normal.     Breath sounds: Normal breath sounds. No stridor. No wheezing, rhonchi or rales.     Comments: Clear to auscultation bilaterally Abdominal:     General: Bowel sounds are normal.     Palpations: Abdomen is soft.     Tenderness: There is no abdominal tenderness. There is no right CVA tenderness, left CVA tenderness, guarding or rebound.  Genitourinary:    Penis: Circumcised. Discharge present. No tenderness.   Neurological:     Mental Status: He is alert.  Psychiatric:        Behavior: Behavior is cooperative.     UC Treatments / Results  Labs (all labs ordered are listed, but only abnormal results are displayed) Labs Reviewed  POCT URINALYSIS DIPSTICK, ED / UC - Abnormal; Notable for the following components:      Result Value   Ketones, ur TRACE (*)    Protein, ur 30 (*)    All other  components within normal limits  CYTOLOGY, (ORAL, ANAL, URETHRAL) ANCILLARY ONLY    EKG   Radiology No results found.  Procedures Procedures (including critical care time)  Medications Ordered in UC Medications  cefTRIAXone (ROCEPHIN) injection 500 mg (has no administration in time range)    Initial Impression / Assessment and Plan / UC Course  I have reviewed the triage vital signs and the nursing notes.  Pertinent labs & imaging results that were available during my care of the patient were reviewed by me and considered in my medical decision making (see chart for details).     STI swab was collected today-results pending.  UA showed no significant evidence of infection.  Patient was empirically treated for gonorrhea.  Will defer additional antibiotic treatment until STI swab results are obtained.  He was instructed to abstain from sexual activity until results are obtained and all partners are tested and treated.  He will need to abstain from sex for 7 days from treatment.  Discussed the importance of safe sex practices.  Discussed alarm symptoms that warrant emergent evaluation.  Strict return precautions given to which he expressed understanding.  Final Clinical Impressions(s) / UC Diagnoses   Final diagnoses:  Penile discharge  Concern about STD in male without diagnosis  Routine screening for STI (sexually transmitted infection)  Dysuria     Discharge Instructions      We treated you for gonorrhea today.  We will contact you with your swab results if need to arrange any additional treatment within the next few days.  Please follow your MyChart for these results.  It is important that you abstain from sex until you receive these results.  All partners need to be tested and treated as you can get this again.  Use a condom with each sexual encounter.  If you have any persistent or worsening symptoms please return for reevaluation.     ED Prescriptions   None     PDMP not reviewed this encounter.   Jeani Hawking, PA-C 08/30/21 1925

## 2021-08-30 NOTE — ED Triage Notes (Signed)
Pt presents with penile discharge. States it has been going on for 2 days. States he has painful urination.

## 2021-08-30 NOTE — Discharge Instructions (Addendum)
We treated you for gonorrhea today.  We will contact you with your swab results if need to arrange any additional treatment within the next few days.  Please follow your MyChart for these results.  It is important that you abstain from sex until you receive these results.  All partners need to be tested and treated as you can get this again.  Use a condom with each sexual encounter.  If you have any persistent or worsening symptoms please return for reevaluation.

## 2021-08-31 ENCOUNTER — Ambulatory Visit (HOSPITAL_COMMUNITY): Payer: Self-pay

## 2021-08-31 LAB — CYTOLOGY, (ORAL, ANAL, URETHRAL) ANCILLARY ONLY
Chlamydia: NEGATIVE
Comment: NEGATIVE
Comment: NEGATIVE
Comment: NORMAL
Neisseria Gonorrhea: NEGATIVE
Trichomonas: NEGATIVE

## 2021-09-04 ENCOUNTER — Telehealth (HOSPITAL_COMMUNITY): Payer: Self-pay | Admitting: Emergency Medicine

## 2021-09-04 NOTE — Telephone Encounter (Signed)
Pt called UC asking if medication was sent to pharmacy on 10/18. Spoke with pt to advise that no medication was needed with cyto results being negative. Advised pt if still having symptoms, need to be reevaluated.

## 2021-11-11 ENCOUNTER — Ambulatory Visit: Admit: 2021-11-11 | Discharge status: Against Medical Advice | Payer: Medicaid Other

## 2021-11-15 ENCOUNTER — Ambulatory Visit
Admission: EM | Admit: 2021-11-15 | Discharge: 2021-11-15 | Disposition: A | Discharge status: Home / Self Care | Payer: Medicaid Other | Attending: Physician Assistant | Admitting: Physician Assistant

## 2021-11-15 ENCOUNTER — Other Ambulatory Visit: Payer: Self-pay

## 2021-11-15 DIAGNOSIS — M545 Low back pain, unspecified: Secondary | ICD-10-CM | POA: Diagnosis not present

## 2021-11-15 MED ORDER — PREDNISONE 20 MG PO TABS: 40.0000 mg | ORAL_TABLET | Freq: Every day | ORAL | 0 refills | Status: AC

## 2021-11-15 MED ORDER — CYCLOBENZAPRINE HCL 10 MG PO TABS: 10.0000 mg | ORAL_TABLET | Freq: Two times a day (BID) | ORAL | 0 refills | Status: AC | PRN
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
## 2021-11-16 ENCOUNTER — Ambulatory Visit: Payer: Self-pay
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
## 2022-04-29 ENCOUNTER — Emergency Department (HOSPITAL_COMMUNITY)
Admission: EM | Admit: 2022-04-29 | Discharge: 2022-04-29 | Disposition: A | Discharge status: Home / Self Care | Payer: Medicaid Other | Attending: Emergency Medicine | Admitting: Emergency Medicine

## 2022-04-29 ENCOUNTER — Encounter (HOSPITAL_COMMUNITY): Payer: Self-pay

## 2022-04-29 ENCOUNTER — Other Ambulatory Visit: Payer: Self-pay

## 2022-04-29 DIAGNOSIS — T391X1A Poisoning by 4-Aminophenol derivatives, accidental (unintentional), initial encounter: Secondary | ICD-10-CM | POA: Insufficient documentation

## 2022-04-29 DIAGNOSIS — T50901A Poisoning by unspecified drugs, medicaments and biological substances, accidental (unintentional), initial encounter: Secondary | ICD-10-CM

## 2022-04-29 LAB — CBC
HCT: 46.7 % (ref 39.0–52.0)
Hemoglobin: 16.3 g/dL (ref 13.0–17.0)
MCH: 33.1 pg (ref 26.0–34.0)
MCHC: 34.9 g/dL (ref 30.0–36.0)
MCV: 94.9 fL (ref 80.0–100.0)
Platelets: 259 10*3/uL (ref 150–400)
RBC: 4.92 MIL/uL (ref 4.22–5.81)
RDW: 11 % — ABNORMAL LOW (ref 11.5–15.5)
WBC: 10.2 10*3/uL (ref 4.0–10.5)
nRBC: 0 % (ref 0.0–0.2)

## 2022-04-29 LAB — COMPREHENSIVE METABOLIC PANEL
ALT: 19 U/L (ref 0–44)
AST: 26 U/L (ref 15–41)
Albumin: 4.9 g/dL (ref 3.5–5.0)
Alkaline Phosphatase: 72 U/L (ref 38–126)
Anion gap: 11 (ref 5–15)
BUN: 12 mg/dL (ref 6–20)
CO2: 26 mmol/L (ref 22–32)
Calcium: 9.9 mg/dL (ref 8.9–10.3)
Chloride: 103 mmol/L (ref 98–111)
Creatinine, Ser: 0.93 mg/dL (ref 0.61–1.24)
GFR, Estimated: >60 mL/min (ref >60)
Glucose, Bld: 106 mg/dL — ABNORMAL HIGH (ref 70–99)
Potassium: 4 mmol/L (ref 3.5–5.1)
Sodium: 140 mmol/L (ref 135–145)
Total Bilirubin: 1 mg/dL (ref 0.3–1.2)
Total Protein: 8.2 g/dL — ABNORMAL HIGH (ref 6.5–8.1)

## 2022-04-29 LAB — URINALYSIS, ROUTINE W REFLEX MICROSCOPIC
Bacteria, UA: NONE SEEN
Bilirubin Urine: NEGATIVE
Glucose, UA: NEGATIVE mg/dL
Ketones, ur: 5 mg/dL — AB
Leukocytes,Ua: NEGATIVE
Nitrite: NEGATIVE
Protein, ur: >=300 mg/dL — AB
Specific Gravity, Urine: 1.027 (ref 1.005–1.030)
pH: 5 (ref 5.0–8.0)

## 2022-04-29 LAB — RAPID URINE DRUG SCREEN, HOSP PERFORMED
Amphetamines: NOT DETECTED
Barbiturates: NOT DETECTED
Benzodiazepines: NOT DETECTED
Cocaine: NOT DETECTED
Opiates: NOT DETECTED
Tetrahydrocannabinol: POSITIVE — AB

## 2022-04-29 LAB — LIPASE, BLOOD: Lipase: 22 U/L (ref 11–51)

## 2022-04-29 MED ORDER — ONDANSETRON HCL 4 MG/2ML IJ SOLN: 4.0000 mg | Freq: Once | INTRAMUSCULAR | Status: DC

## 2022-04-29 MED ORDER — SODIUM CHLORIDE 0.9 % IV BOLUS: 1000.0000 mL | Freq: Once | INTRAVENOUS | Status: DC

## 2022-04-29 NOTE — ED Notes (Signed)
Pt was extremely rude with staff and demanding his discharge papers. This RN informed the pt that I was not his nurse but that the doctor was working on them. Pt started cussing stating this was ridiculous that he was fine and did not need to stay. This RN asked the pt politely not to speak to staff that way but he had every right to leave if he wanted to. Pt stated "say less I don't need those papers anyways, I am good." Pt left without allowing this RN to get vitals. This RN informed the pt's RN and the EDP.

## 2022-04-29 NOTE — Discharge Instructions (Signed)
Return back to the ER if your symptoms worsen or if you have any additional concerns.  Follow-up with your doctor this week.
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
## 2022-05-28 ENCOUNTER — Ambulatory Visit (HOSPITAL_COMMUNITY)
Admission: EM | Admit: 2022-05-28 | Discharge: 2022-05-28 | Disposition: A | Discharge status: Home / Self Care | Payer: Medicaid Other | Attending: Student | Admitting: Student

## 2022-05-28 DIAGNOSIS — R369 Urethral discharge, unspecified: Secondary | ICD-10-CM | POA: Insufficient documentation

## 2022-05-28 MED ORDER — DOXYCYCLINE HYCLATE 100 MG PO CAPS: 100.0000 mg | ORAL_CAPSULE | Freq: Two times a day (BID) | ORAL | 0 refills | Status: AC

## 2022-05-28 MED ORDER — LIDOCAINE HCL (PF) 1 % IJ SOLN
INTRAMUSCULAR | Status: AC
  Filled 2022-05-28: qty 2

## 2022-05-28 MED ORDER — CEFTRIAXONE SODIUM 500 MG IJ SOLR
500.0000 mg | Freq: Once | INTRAMUSCULAR | Status: AC
  Administered 2022-05-28: 500 mg via INTRAMUSCULAR

## 2022-05-28 MED ORDER — CEFTRIAXONE SODIUM 500 MG IJ SOLR
INTRAMUSCULAR | Status: AC
  Filled 2022-05-28: qty 500

## 2022-05-28 NOTE — ED Triage Notes (Signed)
Patient presents to Urgent Care with complaints of penile pain and yellow white discharge and pain with urination since Saturday. Patient reports recent unprotected sex and request for sti check .

## 2022-05-28 NOTE — ED Provider Notes (Signed)
[facility]    CSN: [id number] Arrival date & time: 05/28/22  1514      History   Chief Complaint Chief Complaint  Patient presents with   Exposure to STD    I need a std shot - Entered by patient    HPI Corey [name] is a 21 y.o. male  presenting with penile discharge following unprotected intercourse. History multiple visits for the same in  the last year.  Describes dysuria and penile discharge that is white and yellow in color.  Denies abdominal pain, flank pain, fever/chills.  Pulse was incidentally noted to be elevated, he attributes this to a black and mild cigarette that he smoked immediately prior to the visit, denies chest pain or shortness of breath. Denies hematuria, dysuria, frequency, urgency, back pain, n/v/d/abd pain, fevers/chills, abdnormal penile/testicular swelling.   HPI  No past medical history on file.  There are no problems to display for this patient.   No past surgical history on file.     Home Medications    Prior to Admission medications   Medication Sig Start Date End Date Taking? Authorizing Provider  doxycycline (VIBRAMYCIN) 100 MG capsule Take 1 capsule (100 mg total) by mouth 2 (two) times daily for 7 days. 05/28/22 06/04/22 Yes Rhys Martini, PA-C  cyclobenzaprine (FLEXERIL) 10 MG tablet Take 1 tablet (10 mg total) by mouth 2 (two) times daily as needed for muscle spasms. 11/15/21   Tomi Bamberger, PA-C    Family History Family History  Problem Relation Age of Onset   Healthy Mother    Healthy Father     Social History Social History   Tobacco Use   Smoking status: Former    Types: Cigarettes    Quit date: 12/2019    Years since quitting: 2.4   Smokeless tobacco: Never  Vaping Use   Vaping Use: Never used  Substance Use Topics   Alcohol use: No   Drug use: Not Currently     Allergies   Patient has no known allergies.   Review of Systems Review of Systems  Constitutional:  Negative for chills and fever.   HENT:  Negative for sore throat.   Eyes:  Negative for pain and redness.  Respiratory:  Negative for shortness of breath.   Cardiovascular:  Negative for chest pain.  Gastrointestinal:  Negative for abdominal pain, diarrhea, nausea and vomiting.  Genitourinary:  Positive for penile discharge. Negative for decreased urine volume, difficulty urinating, dysuria, flank pain, frequency, genital sores, hematuria, penile pain, penile swelling, scrotal swelling, testicular pain and urgency.  Musculoskeletal:  Negative for back pain.  Skin:  Negative for rash.  All other systems reviewed and are negative.    Physical Exam Triage Vital Signs ED Triage Vitals  Enc Vitals Group     BP 05/28/22 1537 133/85     Pulse Rate 05/28/22 1537 (!) 124     Resp 05/28/22 1537 15     Temp 05/28/22 1537 98.3 F (36.8 C)     Temp Source 05/28/22 1537 Oral     SpO2 05/28/22 1537 98 %     Weight --      Height --      Head Circumference --      Peak Flow --      Pain Score 05/28/22 1536 5     Pain Loc --      Pain Edu? --      Excl. in GC? --    No  data found.  Updated Vital Signs BP 133/85   Pulse (!) 124   Temp 98.3 F (36.8 C) (Oral)   Resp 15   SpO2 98%   Visual Acuity Right Eye Distance:   Left Eye Distance:   Bilateral Distance:    Right Eye Near:   Left Eye Near:    Bilateral Near:     Physical Exam Vitals reviewed.  Constitutional:      General: He is not in acute distress.    Appearance: Normal appearance. He is not ill-appearing.  HENT:     Head: Normocephalic and atraumatic.     Mouth/Throat:     Mouth: Mucous membranes are moist.     Comments: Moist mucous membranes Eyes:     Extraocular Movements: Extraocular movements intact.     Pupils: Pupils are equal, round, and reactive to light.  Cardiovascular:     Rate and Rhythm: Regular rhythm. Tachycardia present.     Heart sounds: Normal heart sounds.     Comments: Radial pulses equal and regular No pedal edema or  calf swelling  Pulmonary:     Effort: Pulmonary effort is normal.     Breath sounds: Normal breath sounds. No wheezing, rhonchi or rales.  Abdominal:     General: Bowel sounds are normal. There is no distension.     Palpations: Abdomen is soft. There is no mass.     Tenderness: There is no abdominal tenderness. There is no right CVA tenderness, left CVA tenderness, guarding or rebound.  Skin:    General: Skin is warm.     Capillary Refill: Capillary refill takes less than 2 seconds.     Comments: Good skin turgor  Neurological:     General: No focal deficit present.     Mental Status: He is alert and oriented to person, place, and time.  Psychiatric:        Mood and Affect: Mood normal.        Behavior: Behavior normal.      UC Treatments / Results  Labs (all labs ordered are listed, but only abnormal results are displayed) Labs Reviewed  CYTOLOGY, (ORAL, ANAL, URETHRAL) ANCILLARY ONLY    EKG   Radiology No results found.  Procedures Procedures (including critical care time)  Medications Ordered in UC Medications  cefTRIAXone (ROCEPHIN) injection 500 mg (has no administration in time range)    Initial Impression / Assessment and Plan / UC Course  I have reviewed the triage vital signs and the nursing notes.  Pertinent labs & imaging results that were available during my care of the patient were reviewed by me and considered in my medical decision making (see chart for details).     This patient is a very pleasant 21 y.o. year old male presenting with penile discharge. Afebrile, no reproducible abd pain or CVAT.  He is tachycardic at 113, attributes this to black and mild cigarette that he just smoked.  Denies chest pain, shortness of breath.  Will send self-swab for G/C, trich. Declines HIV, RPR. Safe sex precautions.   IM rocephin administered and doxycycline sent.   ED return precautions discussed. Patient verbalizes understanding and agreement.    Final  Clinical Impressions(s) / UC Diagnoses   Final diagnoses:  Penile discharge     Discharge Instructions      -We treated you with a shot of Rocephin today, this is the treatment for gonorrhea. -Doxycycline twice daily for 7 days to treat for chlamydia.  Make sure  to wear sunscreen while spending time outside while on this medication as it can increase your chance of sunburn. You can take this medication with food if you have a sensitive stomach. -We have sent testing for sexually transmitted infections. We will notify you of any positive results once they are received. If required, we will prescribe any medications you might need. Please refrain from all sexual activity until treatment is complete.  -Seek additional medical attention if you develop fevers/chills, new/worsening abdominal pain, new/worsening penile discomfort/discharge, etc.     ED Prescriptions     Medication Sig Dispense Auth. Provider   doxycycline (VIBRAMYCIN) 100 MG capsule Take 1 capsule (100 mg total) by mouth 2 (two) times daily for 7 days. 14 capsule Rhys Martini, PA-C      PDMP not reviewed this encounter.   Rhys Martini, PA-C 05/28/22 [phone]

## 2022-05-28 NOTE — Discharge Instructions (Addendum)
-  We treated you with a shot of Rocephin today, this is the treatment for gonorrhea. -Doxycycline twice daily for 7 days to treat for chlamydia.  Make sure to wear sunscreen while spending time outside while on this medication as it can increase your chance of sunburn. You can take this medication with food if you have a sensitive stomach. -We have sent testing for sexually transmitted infections. We will notify you of any positive results once they are received. If required, we will prescribe any medications you might need. Please refrain from all sexual activity until treatment is complete.  -Seek additional medical attention if you develop fevers/chills, new/worsening abdominal pain, new/worsening penile discomfort/discharge, etc.

## 2022-05-31 LAB — CYTOLOGY, (ORAL, ANAL, URETHRAL) ANCILLARY ONLY
Chlamydia: POSITIVE — AB
Comment: NEGATIVE
Comment: NEGATIVE
Comment: NORMAL
Neisseria Gonorrhea: NEGATIVE
Trichomonas: POSITIVE — AB

## 2022-06-01 ENCOUNTER — Telehealth (HOSPITAL_COMMUNITY): Payer: Self-pay | Admitting: Emergency Medicine

## 2022-06-01 MED ORDER — METRONIDAZOLE 500 MG PO TABS: 2000.0000 mg | ORAL_TABLET | Freq: Once | ORAL | 0 refills | Status: AC

## 2023-01-05 ENCOUNTER — Emergency Department (HOSPITAL_COMMUNITY)
Admission: EM | Admit: 2023-01-05 | Discharge: 2023-01-05 | Disposition: A | Discharge status: Home / Self Care | Payer: Medicaid Other | Attending: Emergency Medicine | Admitting: Emergency Medicine

## 2023-01-05 ENCOUNTER — Other Ambulatory Visit: Payer: Self-pay

## 2023-01-05 ENCOUNTER — Encounter (HOSPITAL_COMMUNITY): Payer: Self-pay

## 2023-01-05 DIAGNOSIS — F1610 Hallucinogen abuse, uncomplicated: Secondary | ICD-10-CM | POA: Diagnosis present
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
## 2023-01-05 NOTE — Discharge Instructions (Signed)
You were seen today after having significant agitation likely secondary to ecstasy use.  He should avoid illicit drug use as you can never know how you may react.
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
## 2023-01-05 NOTE — ED Notes (Signed)
Patient given discharge instructions and follow up care. Patient verbalized understanding. Patient ambulatory out of ED to wait on ride.

## 2023-01-11 ENCOUNTER — Ambulatory Visit: Payer: Medicaid Other

## 2023-01-14 ENCOUNTER — Emergency Department (HOSPITAL_COMMUNITY)
Admission: EM | Admit: 2023-01-14 | Discharge: 2023-01-14 | Disposition: A | Discharge status: Home / Self Care | Payer: Medicaid Other | Attending: Emergency Medicine | Admitting: Emergency Medicine

## 2023-01-14 ENCOUNTER — Other Ambulatory Visit: Payer: Self-pay

## 2023-01-14 ENCOUNTER — Encounter (HOSPITAL_COMMUNITY): Payer: Self-pay

## 2023-01-14 DIAGNOSIS — F419 Anxiety disorder, unspecified: Secondary | ICD-10-CM | POA: Diagnosis present

## 2023-01-14 DIAGNOSIS — F1610 Hallucinogen abuse, uncomplicated: Secondary | ICD-10-CM | POA: Diagnosis not present

## 2023-01-14 NOTE — ED Notes (Signed)
Discharge papers provided, pt left ED in stable ambulatory condition with driver. Questions addressed
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
## 2023-01-14 NOTE — ED Triage Notes (Signed)
Pt states that he took some ecstasy tonight and was feeling anxious. Pt states that he just wants to be around medical professionals who can calm him down.

## 2023-03-16 ENCOUNTER — Telehealth: Payer: Medicaid Other | Admitting: Family Medicine

## 2023-03-16 DIAGNOSIS — R079 Chest pain, unspecified: Secondary | ICD-10-CM | POA: Diagnosis not present

## 2023-03-16 DIAGNOSIS — R42 Dizziness and giddiness: Secondary | ICD-10-CM

## 2023-03-16 DIAGNOSIS — R509 Fever, unspecified: Secondary | ICD-10-CM

## 2023-03-16 NOTE — Progress Notes (Signed)
Virtual Visit Consent   Corey [name], you are scheduled for a virtual visit with a [facility] provider today. Just as with appointments in the office, your consent must be obtained to participate. Your consent will be active for this visit and any virtual visit you may have with one of our providers in the next 365 days. If you have a MyChart account, a copy of this consent can be sent to you electronically.  As this is a virtual visit, video technology does not allow for your provider to perform a traditional examination. This may limit your provider's ability to fully assess your condition. If your provider identifies any concerns that need to be evaluated in person or the need to arrange testing (such as labs, EKG, etc.), we will make arrangements to do so. Although advances in technology are sophisticated, we cannot ensure that it will always work on either your end or our end. If the connection with a video visit is poor, the visit may have to be switched to a telephone visit. With either a video or telephone visit, we are not always able to ensure that we have a secure connection.  By engaging in this virtual visit, you consent to the provision of healthcare and authorize for your insurance to be billed (if applicable) for the services provided during this visit. Depending on your insurance coverage, you may receive a charge related to this service.  I need to obtain your verbal consent now. Are you willing to proceed with your visit today? Corey [name] has provided verbal consent on 03/16/2023 for a virtual visit (video or telephone). Georgana Curio, FNP  Date: 03/16/2023 4:59 PM  Virtual Visit via Video Note   I, Georgana Curio, connected with  Corey [name]  ([id number], 07-25-2001) on 03/16/23 at  5:00 PM EDT by a video-enabled telemedicine application and verified that I am speaking with the correct person using two identifiers.  Location: Patient: Virtual Visit Location Patient: Home Provider:  Virtual Visit Location Provider: Home Office   I discussed the limitations of evaluation and management by telemedicine and the availability of in person appointments. The patient expressed understanding and agreed to proceed.    History of Present Illness: Corey [name] is a 22 y.o. who identifies as a male who was assigned male at birth, and is being seen today for fever, dizziness, chest pain, today at work persistent. He says he had cardiac eval a couple of months ago that was normal. .  HPI: HPI  Problems: There are no problems to display for this patient.   Allergies: No Known Allergies Medications:  Current Outpatient Medications:    cyclobenzaprine (FLEXERIL) 10 MG tablet, Take 1 tablet (10 mg total) by mouth 2 (two) times daily as needed for muscle spasms., Disp: 20 tablet, Rfl: 0  Observations/Objective: Patient is well-developed, well-nourished in no acute distress.  Resting comfortably  at home.  Head is normocephalic, atraumatic.  No labored breathing. Speech is clear and coherent with logical content.  Patient is alert and oriented at baseline.    Assessment and Plan: 1. Chest pain, unspecified type  2. Fever, unspecified fever cause  3. Dizziness  Proceed to urgent care or ED for further eval.   Follow Up Instructions: I discussed the assessment and treatment plan with the patient. The patient was provided an opportunity to ask questions and all were answered. The patient agreed with the plan and demonstrated an understanding of the instructions.  A copy of instructions were sent to  the patient via MyChart unless otherwise noted below.     The patient was advised to call back or seek an in-person evaluation if the symptoms worsen or if the condition fails to improve as anticipated.  Time:  I spent 10 minutes with the patient via telehealth technology discussing the above problems/concerns.    Georgana Curio, FNP

## 2023-03-16 NOTE — Patient Instructions (Signed)
Nonspecific Chest Pain, Adult Chest pain is an uncomfortable, tight, or painful feeling in the chest. The pain can feel like a crushing, aching, or squeezing pressure. A person can feel a burning or tingling sensation. Chest pain can also be felt in your back, neck, jaw, shoulder, or arm. This pain can be worse when you move, sneeze, or take a deep breath. Chest pain can be caused by a condition that is life-threatening. This must be treated right away. It can also be caused by something that is not life-threatening. If you have chest pain, it can be hard to know the difference, so it is important to get help right away to make sure that you do not have a serious condition. Some life-threatening causes of chest pain include: Heart attack. A tear in the body's main blood vessel (aortic dissection). Inflammation around your heart (pericarditis). A problem in the lungs, such as a blood clot (pulmonary embolism) or a collapsed lung (pneumothorax). Some non life-threatening causes of chest pain include: Heartburn. Anxiety or stress. Damage to the bones, muscles, and cartilage that make up your chest wall. Pneumonia or bronchitis. Shingles infection (varicella-zoster virus). Your chest pain may come and go. It may also be constant. Your health care provider will do tests and other studies to find the cause of your pain. Treatment will depend on the cause of your chest pain. Follow these instructions at home: Medicines Take over-the-counter and prescription medicines only as told by your health care provider. If you were prescribed an antibiotic medicine, take it as told by your health care provider. Do not stop taking the antibiotic even if you start to feel better. Activity Avoid any activities that cause chest pain. Do not lift anything that is heavier than 10 lb (4.5 kg), or the limit that you are told, until your health care provider says that it is safe. Rest as directed by your health care  provider. Return to your normal activities only as told by your health care provider. Ask your health care provider what activities are safe for you. Lifestyle     Do not use any products that contain nicotine or tobacco, such as cigarettes, e-cigarettes, and chewing tobacco. If you need help quitting, ask your health care provider. Do not drink alcohol. Make healthy lifestyle changes as recommended. These may include: Getting regular exercise. Ask your health care provider to suggest some exercises that are safe for you. Eating a heart-healthy diet. This includes plenty of fresh fruits and vegetables, whole grains, low-fat (lean) protein, and low-fat dairy products. A dietitian can help you find healthy eating options. Maintaining a healthy weight. Managing any other health conditions you may have, such as high blood pressure (hypertension) or diabetes. Reducing stress, such as with yoga or relaxation techniques. General instructions Pay attention to any changes in your symptoms. It is up to you to get the results of any tests that were done. Ask your health care provider, or the department that is doing the tests, when your results will be ready. Keep all follow-up visits as told by your health care provider. This is important. You may be asked to go for further testing if your chest pain does not go away. Contact a health care provider if: Your chest pain does not go away. You feel depressed. You have a fever. You notice changes in your symptoms or develop new symptoms. Get help right away if: Your chest pain gets worse. You have a cough that gets worse, or you   cough up blood. You have severe pain in your abdomen. You faint. You have sudden, unexplained chest discomfort. You have sudden, unexplained discomfort in your arms, back, neck, or jaw. You have shortness of breath at any time. You suddenly start to sweat, or your skin gets clammy. You feel nausea or you vomit. You  suddenly feel lightheaded or dizzy. You have severe weakness, or unexplained weakness or fatigue. Your heart begins to beat quickly, or it feels like it is skipping beats. These symptoms may represent a serious problem that is an emergency. Do not wait to see if the symptoms will go away. Get medical help right away. Call your local emergency services (911 in the [place] Do not drive yourself to the hospital. Summary Chest pain can be caused by a condition that is serious and requires urgent treatment. It may also be caused by something that is not life-threatening. Your health care provider may do lab tests and other studies to find the cause of your pain. Follow your health care provider's instructions on taking medicines, making lifestyle changes, and getting emergency treatment if symptoms become worse. Keep all follow-up visits as told by your health care provider. This includes visits for any further testing if your chest pain does not go away. This information is not intended to replace advice given to you by your health care provider. Make sure you discuss any questions you have with your health care provider. Document Revised: 09/14/2022 Document Reviewed: 09/14/2022 [employer] Patient Education  2023 [employer]  

## 2023-03-20 ENCOUNTER — Ambulatory Visit: Payer: Medicaid Other

## 2023-04-08 ENCOUNTER — Telehealth: Payer: Medicaid Other | Admitting: Nurse Practitioner

## 2023-04-08 ENCOUNTER — Encounter: Payer: Self-pay | Admitting: Nurse Practitioner
